# Patient Record
Sex: Female | Born: 1955
Health system: Southern US, Community
[De-identification: ages and names within clinical notes are randomized; demographics above are authoritative.]

## PROBLEM LIST (undated history)

## (undated) DIAGNOSIS — M1990 Unspecified osteoarthritis, unspecified site: Secondary | ICD-10-CM

## (undated) DIAGNOSIS — K5900 Constipation, unspecified: Secondary | ICD-10-CM

## (undated) DIAGNOSIS — J189 Pneumonia, unspecified organism: Secondary | ICD-10-CM

## (undated) DIAGNOSIS — E079 Disorder of thyroid, unspecified: Secondary | ICD-10-CM

## (undated) DIAGNOSIS — I499 Cardiac arrhythmia, unspecified: Secondary | ICD-10-CM

## (undated) DIAGNOSIS — E119 Type 2 diabetes mellitus without complications: Secondary | ICD-10-CM

## (undated) DIAGNOSIS — E785 Hyperlipidemia, unspecified: Secondary | ICD-10-CM

## (undated) DIAGNOSIS — I10 Essential (primary) hypertension: Secondary | ICD-10-CM

## (undated) HISTORY — DX: Hyperlipidemia, unspecified: E78.5

## (undated) HISTORY — PX: APPENDECTOMY: SHX54

## (undated) HISTORY — DX: Disorder of thyroid, unspecified: E07.9

## (undated) HISTORY — DX: Constipation, unspecified: K59.00

---

## 2016-01-20 DIAGNOSIS — J329 Chronic sinusitis, unspecified: Secondary | ICD-10-CM | POA: Diagnosis not present

## 2016-04-18 DIAGNOSIS — Z1389 Encounter for screening for other disorder: Secondary | ICD-10-CM | POA: Diagnosis not present

## 2016-04-18 DIAGNOSIS — Z760 Encounter for issue of repeat prescription: Secondary | ICD-10-CM | POA: Diagnosis not present

## 2016-04-18 DIAGNOSIS — I10 Essential (primary) hypertension: Secondary | ICD-10-CM | POA: Diagnosis not present

## 2016-04-18 DIAGNOSIS — Z6828 Body mass index (BMI) 28.0-28.9, adult: Secondary | ICD-10-CM | POA: Diagnosis not present

## 2016-05-17 DIAGNOSIS — Z79899 Other long term (current) drug therapy: Secondary | ICD-10-CM | POA: Diagnosis not present

## 2016-05-17 DIAGNOSIS — E7800 Pure hypercholesterolemia, unspecified: Secondary | ICD-10-CM | POA: Diagnosis not present

## 2016-05-17 DIAGNOSIS — E049 Nontoxic goiter, unspecified: Secondary | ICD-10-CM | POA: Diagnosis not present

## 2016-07-03 DIAGNOSIS — H60539 Acute contact otitis externa, unspecified ear: Secondary | ICD-10-CM | POA: Diagnosis not present

## 2017-04-09 DIAGNOSIS — Z6827 Body mass index (BMI) 27.0-27.9, adult: Secondary | ICD-10-CM | POA: Diagnosis not present

## 2017-04-09 DIAGNOSIS — J0100 Acute maxillary sinusitis, unspecified: Secondary | ICD-10-CM | POA: Diagnosis not present

## 2017-05-20 DIAGNOSIS — I10 Essential (primary) hypertension: Secondary | ICD-10-CM | POA: Diagnosis not present

## 2017-05-20 DIAGNOSIS — E041 Nontoxic single thyroid nodule: Secondary | ICD-10-CM | POA: Diagnosis not present

## 2017-05-20 DIAGNOSIS — E7800 Pure hypercholesterolemia, unspecified: Secondary | ICD-10-CM | POA: Diagnosis not present

## 2017-05-20 DIAGNOSIS — Z1331 Encounter for screening for depression: Secondary | ICD-10-CM | POA: Diagnosis not present

## 2017-06-05 DIAGNOSIS — E041 Nontoxic single thyroid nodule: Secondary | ICD-10-CM | POA: Diagnosis not present

## 2017-06-05 DIAGNOSIS — E785 Hyperlipidemia, unspecified: Secondary | ICD-10-CM | POA: Diagnosis not present

## 2017-06-05 DIAGNOSIS — Z79899 Other long term (current) drug therapy: Secondary | ICD-10-CM | POA: Diagnosis not present

## 2017-06-05 DIAGNOSIS — E7800 Pure hypercholesterolemia, unspecified: Secondary | ICD-10-CM | POA: Diagnosis not present

## 2017-07-13 DIAGNOSIS — S80861A Insect bite (nonvenomous), right lower leg, initial encounter: Secondary | ICD-10-CM | POA: Diagnosis not present

## 2017-10-31 DIAGNOSIS — M79671 Pain in right foot: Secondary | ICD-10-CM | POA: Diagnosis not present

## 2017-10-31 DIAGNOSIS — Z6828 Body mass index (BMI) 28.0-28.9, adult: Secondary | ICD-10-CM | POA: Diagnosis not present

## 2017-10-31 DIAGNOSIS — Z23 Encounter for immunization: Secondary | ICD-10-CM | POA: Diagnosis not present

## 2017-10-31 DIAGNOSIS — Z1339 Encounter for screening examination for other mental health and behavioral disorders: Secondary | ICD-10-CM | POA: Diagnosis not present

## 2017-12-26 DIAGNOSIS — M5412 Radiculopathy, cervical region: Secondary | ICD-10-CM | POA: Diagnosis not present

## 2017-12-26 DIAGNOSIS — M79671 Pain in right foot: Secondary | ICD-10-CM | POA: Diagnosis not present

## 2017-12-26 DIAGNOSIS — I10 Essential (primary) hypertension: Secondary | ICD-10-CM | POA: Diagnosis not present

## 2017-12-26 DIAGNOSIS — M5000 Cervical disc disorder with myelopathy, unspecified cervical region: Secondary | ICD-10-CM | POA: Diagnosis not present

## 2018-01-27 DIAGNOSIS — M7751 Other enthesopathy of right foot: Secondary | ICD-10-CM | POA: Diagnosis not present

## 2018-01-27 DIAGNOSIS — M722 Plantar fascial fibromatosis: Secondary | ICD-10-CM | POA: Diagnosis not present

## 2018-02-06 DIAGNOSIS — J111 Influenza due to unidentified influenza virus with other respiratory manifestations: Secondary | ICD-10-CM | POA: Diagnosis not present

## 2018-08-31 DIAGNOSIS — Z23 Encounter for immunization: Secondary | ICD-10-CM | POA: Diagnosis not present

## 2018-08-31 DIAGNOSIS — Z203 Contact with and (suspected) exposure to rabies: Secondary | ICD-10-CM | POA: Diagnosis not present

## 2018-08-31 DIAGNOSIS — Z2914 Encounter for prophylactic rabies immune globin: Secondary | ICD-10-CM | POA: Diagnosis not present

## 2018-09-03 DIAGNOSIS — Z23 Encounter for immunization: Secondary | ICD-10-CM | POA: Diagnosis not present

## 2018-09-03 DIAGNOSIS — Z203 Contact with and (suspected) exposure to rabies: Secondary | ICD-10-CM | POA: Diagnosis not present

## 2018-09-03 DIAGNOSIS — Z2914 Encounter for prophylactic rabies immune globin: Secondary | ICD-10-CM | POA: Diagnosis not present

## 2018-09-07 DIAGNOSIS — Z23 Encounter for immunization: Secondary | ICD-10-CM | POA: Diagnosis not present

## 2018-09-07 DIAGNOSIS — Z2914 Encounter for prophylactic rabies immune globin: Secondary | ICD-10-CM | POA: Diagnosis not present

## 2018-09-07 DIAGNOSIS — Z203 Contact with and (suspected) exposure to rabies: Secondary | ICD-10-CM | POA: Diagnosis not present

## 2018-09-15 DIAGNOSIS — Z203 Contact with and (suspected) exposure to rabies: Secondary | ICD-10-CM | POA: Diagnosis not present

## 2018-09-15 DIAGNOSIS — Z2914 Encounter for prophylactic rabies immune globin: Secondary | ICD-10-CM | POA: Diagnosis not present

## 2018-09-15 DIAGNOSIS — Z23 Encounter for immunization: Secondary | ICD-10-CM | POA: Diagnosis not present

## 2018-11-05 DIAGNOSIS — Z6828 Body mass index (BMI) 28.0-28.9, adult: Secondary | ICD-10-CM | POA: Diagnosis not present

## 2018-11-05 DIAGNOSIS — I10 Essential (primary) hypertension: Secondary | ICD-10-CM | POA: Diagnosis not present

## 2018-11-05 DIAGNOSIS — Z23 Encounter for immunization: Secondary | ICD-10-CM | POA: Diagnosis not present

## 2018-11-05 DIAGNOSIS — Z1331 Encounter for screening for depression: Secondary | ICD-10-CM | POA: Diagnosis not present

## 2018-11-13 DIAGNOSIS — Z20828 Contact with and (suspected) exposure to other viral communicable diseases: Secondary | ICD-10-CM | POA: Diagnosis not present

## 2018-12-04 DIAGNOSIS — E041 Nontoxic single thyroid nodule: Secondary | ICD-10-CM | POA: Diagnosis not present

## 2018-12-04 DIAGNOSIS — E7800 Pure hypercholesterolemia, unspecified: Secondary | ICD-10-CM | POA: Diagnosis not present

## 2018-12-04 DIAGNOSIS — Z79899 Other long term (current) drug therapy: Secondary | ICD-10-CM | POA: Diagnosis not present

## 2018-12-05 DIAGNOSIS — Z20828 Contact with and (suspected) exposure to other viral communicable diseases: Secondary | ICD-10-CM | POA: Diagnosis not present

## 2019-05-20 DIAGNOSIS — Z79899 Other long term (current) drug therapy: Secondary | ICD-10-CM | POA: Diagnosis not present

## 2019-05-20 DIAGNOSIS — E041 Nontoxic single thyroid nodule: Secondary | ICD-10-CM | POA: Diagnosis not present

## 2019-05-20 DIAGNOSIS — Z23 Encounter for immunization: Secondary | ICD-10-CM | POA: Diagnosis not present

## 2019-07-30 DIAGNOSIS — M542 Cervicalgia: Secondary | ICD-10-CM | POA: Diagnosis not present

## 2019-07-30 DIAGNOSIS — Z6824 Body mass index (BMI) 24.0-24.9, adult: Secondary | ICD-10-CM | POA: Diagnosis not present

## 2019-07-30 DIAGNOSIS — M79601 Pain in right arm: Secondary | ICD-10-CM | POA: Diagnosis not present

## 2019-09-03 DIAGNOSIS — M4712 Other spondylosis with myelopathy, cervical region: Secondary | ICD-10-CM | POA: Diagnosis not present

## 2019-09-03 DIAGNOSIS — R634 Abnormal weight loss: Secondary | ICD-10-CM | POA: Diagnosis not present

## 2019-09-03 DIAGNOSIS — M4722 Other spondylosis with radiculopathy, cervical region: Secondary | ICD-10-CM | POA: Diagnosis not present

## 2019-09-03 DIAGNOSIS — E1165 Type 2 diabetes mellitus with hyperglycemia: Secondary | ICD-10-CM | POA: Diagnosis not present

## 2019-09-25 DIAGNOSIS — Z124 Encounter for screening for malignant neoplasm of cervix: Secondary | ICD-10-CM | POA: Diagnosis not present

## 2019-09-25 DIAGNOSIS — Z01419 Encounter for gynecological examination (general) (routine) without abnormal findings: Secondary | ICD-10-CM | POA: Diagnosis not present

## 2019-09-25 DIAGNOSIS — Z1151 Encounter for screening for human papillomavirus (HPV): Secondary | ICD-10-CM | POA: Diagnosis not present

## 2019-09-29 DIAGNOSIS — M542 Cervicalgia: Secondary | ICD-10-CM | POA: Diagnosis not present

## 2019-10-05 DIAGNOSIS — M542 Cervicalgia: Secondary | ICD-10-CM | POA: Diagnosis not present

## 2019-10-08 DIAGNOSIS — M542 Cervicalgia: Secondary | ICD-10-CM | POA: Diagnosis not present

## 2019-10-26 DIAGNOSIS — M542 Cervicalgia: Secondary | ICD-10-CM | POA: Diagnosis not present

## 2019-10-27 DIAGNOSIS — Z1231 Encounter for screening mammogram for malignant neoplasm of breast: Secondary | ICD-10-CM | POA: Diagnosis not present

## 2019-10-30 DIAGNOSIS — M542 Cervicalgia: Secondary | ICD-10-CM | POA: Diagnosis not present

## 2019-11-03 DIAGNOSIS — M542 Cervicalgia: Secondary | ICD-10-CM | POA: Diagnosis not present

## 2019-11-09 DIAGNOSIS — M542 Cervicalgia: Secondary | ICD-10-CM | POA: Diagnosis not present

## 2019-11-10 DIAGNOSIS — M5412 Radiculopathy, cervical region: Secondary | ICD-10-CM | POA: Diagnosis not present

## 2019-11-10 DIAGNOSIS — M542 Cervicalgia: Secondary | ICD-10-CM | POA: Diagnosis not present

## 2019-12-15 DIAGNOSIS — H9193 Unspecified hearing loss, bilateral: Secondary | ICD-10-CM | POA: Diagnosis not present

## 2019-12-15 DIAGNOSIS — Z13220 Encounter for screening for lipoid disorders: Secondary | ICD-10-CM | POA: Diagnosis not present

## 2019-12-15 DIAGNOSIS — Z6824 Body mass index (BMI) 24.0-24.9, adult: Secondary | ICD-10-CM | POA: Diagnosis not present

## 2019-12-15 DIAGNOSIS — Z131 Encounter for screening for diabetes mellitus: Secondary | ICD-10-CM | POA: Diagnosis not present

## 2019-12-15 DIAGNOSIS — Z0000 Encounter for general adult medical examination without abnormal findings: Secondary | ICD-10-CM | POA: Diagnosis not present

## 2019-12-15 DIAGNOSIS — E0500 Thyrotoxicosis with diffuse goiter without thyrotoxic crisis or storm: Secondary | ICD-10-CM | POA: Diagnosis not present

## 2019-12-18 DIAGNOSIS — E0500 Thyrotoxicosis with diffuse goiter without thyrotoxic crisis or storm: Secondary | ICD-10-CM | POA: Diagnosis not present

## 2019-12-18 DIAGNOSIS — E041 Nontoxic single thyroid nodule: Secondary | ICD-10-CM | POA: Diagnosis not present

## 2019-12-18 DIAGNOSIS — E034 Atrophy of thyroid (acquired): Secondary | ICD-10-CM | POA: Diagnosis not present

## 2019-12-28 DIAGNOSIS — E041 Nontoxic single thyroid nodule: Secondary | ICD-10-CM | POA: Diagnosis not present

## 2019-12-28 DIAGNOSIS — E0789 Other specified disorders of thyroid: Secondary | ICD-10-CM | POA: Diagnosis not present

## 2020-01-14 DIAGNOSIS — H903 Sensorineural hearing loss, bilateral: Secondary | ICD-10-CM | POA: Diagnosis not present

## 2020-01-14 DIAGNOSIS — H6121 Impacted cerumen, right ear: Secondary | ICD-10-CM | POA: Diagnosis not present

## 2020-01-14 DIAGNOSIS — H61302 Acquired stenosis of left external ear canal, unspecified: Secondary | ICD-10-CM | POA: Diagnosis not present

## 2020-01-14 DIAGNOSIS — H9319 Tinnitus, unspecified ear: Secondary | ICD-10-CM | POA: Diagnosis not present

## 2020-02-03 ENCOUNTER — Ambulatory Visit: Payer: Self-pay | Admitting: Surgery

## 2020-02-03 DIAGNOSIS — Z923 Personal history of irradiation: Secondary | ICD-10-CM | POA: Diagnosis not present

## 2020-02-03 DIAGNOSIS — E041 Nontoxic single thyroid nodule: Secondary | ICD-10-CM | POA: Diagnosis not present

## 2020-02-03 DIAGNOSIS — Z8639 Personal history of other endocrine, nutritional and metabolic disease: Secondary | ICD-10-CM | POA: Diagnosis not present

## 2020-02-03 DIAGNOSIS — D440 Neoplasm of uncertain behavior of thyroid gland: Secondary | ICD-10-CM | POA: Diagnosis not present

## 2020-02-08 ENCOUNTER — Other Ambulatory Visit (HOSPITAL_COMMUNITY)
Admission: RE | Admit: 2020-02-08 | Discharge: 2020-02-08 | Disposition: A | Discharge status: Home / Self Care | Payer: BC Managed Care – PPO | Source: Ambulatory Visit | Attending: Surgery | Admitting: Surgery

## 2020-02-08 DIAGNOSIS — Z01812 Encounter for preprocedural laboratory examination: Secondary | ICD-10-CM | POA: Insufficient documentation

## 2020-02-08 DIAGNOSIS — Z20822 Contact with and (suspected) exposure to covid-19: Secondary | ICD-10-CM | POA: Diagnosis not present

## 2020-02-08 LAB — SARS CORONAVIRUS 2 (TAT 6-24 HRS): SARS Coronavirus 2: NEGATIVE

## 2020-02-08 NOTE — Progress Notes (Signed)
DUE TO COVID-19 ONLY ONE VISITOR IS ALLOWED TO COME WITH YOU AND STAY IN THE WAITING ROOM ONLY DURING PRE OP AND PROCEDURE DAY OF SURGERY. THE 1 VISITOR  MAY VISIT WITH YOU AFTER SURGERY IN YOUR PRIVATE ROOM DURING VISITING HOURS ONLY!  YOU NEED TO HAVE A COVID 19 TEST ON_______ @_______ , THIS TEST MUST BE DONE BEFORE SURGERY,  COVID TESTING SITE [address]  [id number], IT IS ON THE RIGHT GOING OUT [address] APPROXIMATELY  2 MINUTES PAST [employer] ON THE RIGHT. ONCE YOUR COVID TEST IS COMPLETED,  PLEASE BEGIN THE QUARANTINE INSTRUCTIONS AS OUTLINED IN YOUR HANDOUT.                Taylor Griffith  02/08/2020   Your procedure is scheduled on:  [id number]    Report to [facility] Main  Entrance   Report to admitting at    0600 AM     Call this number if you have problems the morning of surgery [phone]    REMEMBER: NO  SOLID FOOD CANDY OR GUM AFTER MIDNIGHT. CLEAR LIQUIDS UNTIL    0500am      . NOTHING BY MOUTH EXCEPT CLEAR LIQUIDS UNTIL    . PLEASE FINISH ENSURE DRINK PER SURGEON ORDER  WHICH NEEDS TO BE COMPLETED AT     0500am  .      CLEAR LIQUID DIET   Foods Allowed                                                                    Coffee and tea, regular and decaf                            Fruit ices (not with fruit pulp)                                      Iced Popsicles                                    Carbonated beverages, regular and diet                                    Cranberry, grape and apple juices Sports drinks like Gatorade Lightly seasoned clear broth or consume(fat free) Sugar, honey syrup ___________________________________________________________________      BRUSH YOUR TEETH MORNING OF SURGERY AND RINSE YOUR MOUTH OUT, NO CHEWING GUM CANDY OR MINTS.     Take these medicines the morning of surgery with A SIP OF WATER: none   DO NOT TAKE ANY DIABETIC MEDICATIONS DAY OF YOUR SURGERY                                You may not have any metal on your body including hair pins and              piercings  Do not  wear jewelry, make-up, lotions, powders or perfumes, deodorant             Do not wear nail polish on your fingernails.  Do not shave  48 hours prior to surgery.              Men may shave face and neck.   Do not bring valuables to the hospital. [place].  Contacts, dentures or bridgework may not be worn into surgery.  Leave suitcase in the car. After surgery it may be brought to your room.     Patients discharged the day of surgery will not be allowed to drive home. IF YOU ARE HAVING SURGERY AND GOING HOME THE SAME DAY, YOU MUST HAVE AN ADULT TO DRIVE YOU HOME AND BE WITH YOU FOR 24 HOURS. YOU MAY GO HOME BY TAXI OR UBER OR ORTHERWISE, BUT AN ADULT MUST ACCOMPANY YOU HOME AND STAY WITH YOU FOR 24 HOURS.  Name and phone number of your driver:  Special Instructions: N/A              Please read over the following fact sheets you were given: _____________________________________________________________________  [facility] - Preparing for Surgery Before surgery, you can play an important role.  Because skin is not sterile, your skin needs to be as free of germs as possible.  You can reduce the number of germs on your skin by washing with CHG (chlorahexidine gluconate) soap before surgery.  CHG is an antiseptic cleaner which kills germs and bonds with the skin to continue killing germs even after washing. Please DO NOT use if you have an allergy to CHG or antibacterial soaps.  If your skin becomes reddened/irritated stop using the CHG and inform your nurse when you arrive at [facility]. Do not shave (including legs and underarms) for at least 48 hours prior to the first CHG shower.  You may shave your face/neck. Please follow these instructions carefully:  1.  Shower with CHG Soap the night before surgery and the  morning of Surgery.  2.  If you  choose to wash your hair, wash your hair first as usual with your  normal  shampoo.  3.  After you shampoo, rinse your hair and body thoroughly to remove the  shampoo.                           4.  Use CHG as you would any other liquid soap.  You can apply chg directly  to the skin and wash                       Gently with a scrungie or clean washcloth.  5.  Apply the CHG Soap to your body ONLY FROM THE NECK DOWN.   Do not use on face/ open                           Wound or open sores. Avoid contact with eyes, ears mouth and genitals (private parts).                       Wash face,  Genitals (private parts) with your normal soap.             6.  Wash thoroughly, paying  special attention to the area where your surgery  will be performed.  7.  Thoroughly rinse your body with warm water from the neck down.  8.  DO NOT shower/wash with your normal soap after using and rinsing off  the CHG Soap.                9.  Pat yourself dry with a clean towel.            10.  Wear clean pajamas.            11.  Place clean sheets on your bed the night of your first shower and do not  sleep with pets. Day of Surgery : Do not apply any lotions/deodorants the morning of surgery.  Please wear clean clothes to the hospital/surgery center.  FAILURE TO FOLLOW THESE INSTRUCTIONS MAY RESULT IN THE CANCELLATION OF YOUR SURGERY PATIENT SIGNATURE_________________________________  NURSE SIGNATURE__________________________________  ________________________________________________________________________

## 2020-02-09 ENCOUNTER — Encounter (INDEPENDENT_AMBULATORY_CARE_PROVIDER_SITE_OTHER): Payer: Self-pay

## 2020-02-09 ENCOUNTER — Encounter (HOSPITAL_COMMUNITY)
Admission: RE | Admit: 2020-02-09 | Discharge: 2020-02-09 | Disposition: A | Discharge status: Home / Self Care | Payer: BC Managed Care – PPO | Source: Ambulatory Visit | Attending: Surgery | Admitting: Surgery

## 2020-02-09 ENCOUNTER — Encounter (HOSPITAL_COMMUNITY): Payer: Self-pay

## 2020-02-09 ENCOUNTER — Ambulatory Visit (HOSPITAL_COMMUNITY)
Admission: RE | Admit: 2020-02-09 | Discharge: 2020-02-09 | Disposition: A | Discharge status: Home / Self Care | Payer: BC Managed Care – PPO | Source: Ambulatory Visit | Attending: Anesthesiology | Admitting: Anesthesiology

## 2020-02-09 ENCOUNTER — Encounter (HOSPITAL_COMMUNITY): Payer: Self-pay | Admitting: Surgery

## 2020-02-09 ENCOUNTER — Other Ambulatory Visit: Payer: Self-pay

## 2020-02-09 DIAGNOSIS — I10 Essential (primary) hypertension: Secondary | ICD-10-CM | POA: Diagnosis not present

## 2020-02-09 DIAGNOSIS — Z01818 Encounter for other preprocedural examination: Secondary | ICD-10-CM | POA: Insufficient documentation

## 2020-02-09 DIAGNOSIS — D440 Neoplasm of uncertain behavior of thyroid gland: Secondary | ICD-10-CM | POA: Diagnosis present

## 2020-02-09 DIAGNOSIS — E041 Nontoxic single thyroid nodule: Secondary | ICD-10-CM | POA: Diagnosis present

## 2020-02-09 HISTORY — DX: Essential (primary) hypertension: I10

## 2020-02-09 HISTORY — DX: Unspecified osteoarthritis, unspecified site: M19.90

## 2020-02-09 HISTORY — DX: Cardiac arrhythmia, unspecified: I49.9

## 2020-02-09 HISTORY — DX: Pneumonia, unspecified organism: J18.9

## 2020-02-09 HISTORY — DX: Type 2 diabetes mellitus without complications: E11.9

## 2020-02-09 LAB — BASIC METABOLIC PANEL
Anion gap: 7 (ref 5–15)
BUN: 15 mg/dL (ref 8–23)
CO2: 30 mmol/L (ref 22–32)
Calcium: 9.7 mg/dL (ref 8.9–10.3)
Chloride: 104 mmol/L (ref 98–111)
Creatinine, Ser: 0.7 mg/dL (ref 0.44–1.00)
GFR, Estimated: >60 mL/min (ref >60)
Glucose, Bld: 115 mg/dL — ABNORMAL HIGH (ref 70–99)
Potassium: 4.1 mmol/L (ref 3.5–5.1)
Sodium: 141 mmol/L (ref 135–145)

## 2020-02-09 LAB — CBC
HCT: 43.7 % (ref 36.0–46.0)
Hemoglobin: 14.1 g/dL (ref 12.0–15.0)
MCH: 29.3 pg (ref 26.0–34.0)
MCHC: 32.3 g/dL (ref 30.0–36.0)
MCV: 90.9 fL (ref 80.0–100.0)
Platelets: 268 10*3/uL (ref 150–400)
RBC: 4.81 MIL/uL (ref 3.87–5.11)
RDW: 13.1 % (ref 11.5–15.5)
WBC: 5.1 10*3/uL (ref 4.0–10.5)
nRBC: 0 % (ref 0.0–0.2)

## 2020-02-09 LAB — HEMOGLOBIN A1C
Hgb A1c MFr Bld: 6.1 % — ABNORMAL HIGH (ref 4.8–5.6)
Mean Plasma Glucose: 128.37 mg/dL

## 2020-02-09 LAB — GLUCOSE, CAPILLARY: Glucose-Capillary: 120 mg/dL — ABNORMAL HIGH (ref 70–99)

## 2020-02-09 NOTE — H&P (Signed)
General Surgery [facility]  Taylor [facility] DOB: 11/19/1955 Married / Language: English / Race: White Female   History of Present Illness   The patient is a 65 year old female who presents with a thyroid nodule.  CHIEF COMPLAINT: thyroid neoplasm of uncertain behavior  Patient is referred by Dr. Serita Grammes for surgical evaluation and management of a newly diagnosed left thyroid neoplasm of uncertain behavior. Patient has a history of thyroid disease dating back many years. She had originally been diagnosed with hyperthyroidism. She underwent treatment with radioactive iodine. Following treatment her thyroid hormone levels normalized and she did not require medication. Patient had a known left-sided nodule which is been present for many years. The patient is unsure as to whether or not this has increased in size. She has no compressive symptoms. She denies any dyspnea or changes in voice. Her most recent TSH level from August 2021 is normal at 3.482. Patient was referred for ultrasound examination which was performed on December 18, 2019. This shows atrophy of the right thyroid lobe measuring only 1.6 x 0.6 x 0.4 cm. Left thyroid lobe is mildly enlarged at 5.7 x 2.7 x 2.8 cm. In the mid left lobe is a dominant nodule measuring 4.6 cm in greatest dimension. This was felt to be moderately suspicious and fine-needle aspiration biopsy was recommended. Patient underwent fine-needle aspiration biopsy on December 28, 2019. 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 This was sent for molecular genetic testing and returned with a result of positive, yielding a risk of malignancy of between 50 and 60%. Patient was therefore referred to surgery for resection for definitive diagnosis and management. Patient has had no prior head or neck surgery. There is a history of medical thyroid disease in multiple family members. There is  no family history of thyroid cancer.   Past Surgical History  Appendectomy   Diagnostic Studies History  Colonoscopy  never Mammogram  within last year Pap Smear  1-5 years ago  Allergies  Sulfa 10 *OPHTHALMIC AGENTS*  Zithromax *MACROLIDES*  Rocephin *CEPHALOSPORINS*  Allergies Reconciled   Medication History  Lisinopril-hydroCHLOROthiazide (20-12.5MG  Tablet, Oral) Active. Rosuvastatin Calcium (5MG  Tablet, Oral) Active. SM Non-Asprin Sinus (30-500MG  Tablet, Oral) Active. Multi Vitamin (Oral) Active. Vitamin D (50 MCG(2000 UT) Capsule, Oral) Active. Medications Reconciled  Social History  Alcohol use  Occasional alcohol use. Caffeine use  Carbonated beverages. No drug use  Tobacco use  Never smoker.  Family History  Alcohol Abuse  Family Members In General. Arthritis  Sister. Cancer  Family Members In General, Mother. Cerebrovascular Accident  Family Members In General. Depression  Mother. Diabetes Mellitus  Father. Heart Disease  Father. Respiratory Condition  Sister. Thyroid problems  Daughter.  Pregnancy / Birth History  Age at menarche  54 years. Age of menopause  51-55 Contraceptive History  Oral contraceptives. Gravida  2 Length (months) of breastfeeding  7-12 Maternal age  34-30 Para  2  Other Problems  Arthritis  Diabetes Mellitus  High blood pressure  Hypercholesterolemia  Other disease, cancer, significant illness  Thyroid Disease   Review of Systems  General Not Present- Appetite Loss, Chills, Fatigue, Fever, Night Sweats, Weight Gain and Weight Loss. Skin Not Present- Change in Wart/Mole, Dryness, Hives, Jaundice, New Lesions, Non-Healing Wounds, Rash and Ulcer. HEENT Present- Hearing Loss, Ringing in the Ears and Wears glasses/contact lenses. Not Present- Earache, Hoarseness, Nose Bleed, Oral Ulcers, Seasonal Allergies, Sinus Pain, Sore Throat, Visual Disturbances and Yellow Eyes.  Respiratory Not  Present- Bloody sputum, Chronic Cough, Difficulty Breathing, Snoring and Wheezing. Breast Not Present- Breast Mass, Breast Pain, Nipple Discharge and Skin Changes. Cardiovascular Not Present- Chest Pain, Difficulty Breathing Lying Down, Leg Cramps, Palpitations, Rapid Heart Rate, Shortness of Breath and Swelling of Extremities. Gastrointestinal Present- Constipation. Not Present- Abdominal Pain, Bloating, Bloody Stool, Change in Bowel Habits, Chronic diarrhea, Difficulty Swallowing, Excessive gas, Gets full quickly at meals, Hemorrhoids, Indigestion, Nausea, Rectal Pain and Vomiting. Female Genitourinary Not Present- Frequency, Nocturia, Painful Urination, Pelvic Pain and Urgency. Musculoskeletal Not Present- Back Pain, Joint Pain, Joint Stiffness, Muscle Pain, Muscle Weakness and Swelling of Extremities. Neurological Not Present- Decreased Memory, Fainting, Headaches, Numbness, Seizures, Tingling, Tremor, Trouble walking and Weakness. Psychiatric Not Present- Anxiety, Bipolar, Change in Sleep Pattern, Depression, Fearful and Frequent crying. Endocrine Present- Hair Changes and Hot flashes. Not Present- Cold Intolerance, Excessive Hunger, Heat Intolerance and New Diabetes. Hematology Not Present- Blood Thinners, Easy Bruising, Excessive bleeding, Gland problems, HIV and Persistent Infections.  Vitals  Weight: 134.5 lb Height: 62in Body Surface Area: 1.61 m Body Mass Index: 24.6 kg/m  Temp.: 98.76F  Pulse: 135 (Regular)  P.OX: 96% (Room air) BP: 142/90(Sitting, Left Arm, Standard)  Physical Exam  GENERAL APPEARANCE Development: normal Nutritional status: normal Gross deformities: none  SKIN Rash, lesions, ulcers: none Induration, erythema: none Nodules: none palpable  EYES Conjunctiva and lids: normal Pupils: equal and reactive Iris: normal bilaterally  EARS, NOSE, MOUTH, THROAT External ears: no lesion or deformity External nose: no lesion or deformity Hearing:  grossly normal Due to Covid-19 pandemic, patient is wearing a mask.  NECK Symmetric: no Trachea: midline Thyroid: There is a dominant smooth mobile nontender mass involving a large portion of the left thyroid lobe measuring approximately 5 cm in greatest dimension. It is mobile with swallowing. Palpation in the right thyroid bed shows no significant nodularity. There is no associated lymphadenopathy.  CHEST Respiratory effort: normal Retraction or accessory muscle use: no Breath sounds: normal bilaterally Rales, rhonchi, wheeze: none  CARDIOVASCULAR Auscultation: regular rhythm, normal rate Murmurs: none Pulses: radial pulse 2+ palpable Lower extremity edema: none  MUSCULOSKELETAL Station and gait: normal Digits and nails: no clubbing or cyanosis Muscle strength: grossly normal all extremities Range of motion: grossly normal all extremities Deformity: none  LYMPHATIC Cervical: none palpable Supraclavicular: none palpable  PSYCHIATRIC Oriented to person, place, and time: yes Mood and affect: normal for situation Judgment and insight: appropriate for situation    Assessment & Plan  NEOPLASM OF UNCERTAIN BEHAVIOR OF THYROID GLAND (D44.0) LEFT THYROID NODULE (E04.1) HISTORY OF HYPERTHYROIDISM (Z86.39) HISTORY OF RADIOACTIVE IODINE THYROID ABLATION (Z92.3)  Patient is referred by her primary care physician for surgical evaluation and management of a newly diagnosed thyroid neoplasm of uncertain behavior.  Patient provided with a copy of "The Thyroid Book: Medical and Surgical Treatment of Thyroid Problems", published by [employer], 16 pages. Book reviewed and explained to patient during visit today.  The patient and I reviewed her ultrasound report, her cytopathology report, and her molecular genetic report. There is a dominant mass in the left thyroid lobe measuring 4.6 cm. This has cytologic atypia. Molecular genetic study gives it a risk of malignancy of 50-60%. We  discussed proceeding with left thyroid lobectomy. We discussed the possibility of removing the atrophic right thyroid lobe if this could be done without increased risk. We discussed the risk of the procedure including the risk of injury to recurrent laryngeal nerves and parathyroid glands. We discussed the size location of  the surgical incision. We discussed the hospital stay to be anticipated. We discussed her postoperative recovery and return to work and activities. We discussed the need for lifelong thyroid hormone replacement. We discussed the potential need for radioactive iodine treatment. The patient understands and wishes to proceed with surgery in the near future.  The risks and benefits of the procedure have been discussed at length with the patient. The patient understands the proposed procedure, potential alternative treatments, and the course of recovery to be expected. All of the patient's questions have been answered at this time. The patient wishes to proceed with surgery.  Armandina Gemma, MD [facility] Office: [phone]

## 2020-02-09 NOTE — Progress Notes (Signed)
Pt received the G2lower  Sugar preop drink at preop appt on 02/09/20.

## 2020-02-10 NOTE — Anesthesia Preprocedure Evaluation (Addendum)
Anesthesia Evaluation  Patient identified by MRN, date of birth, ID band Patient awake    Reviewed: Allergy & Precautions, NPO status , Patient's Chart, lab work & pertinent test results  Airway Mallampati: I  TM Distance: >3 FB Neck ROM: Full    Dental no notable dental hx. (+) Teeth Intact, Dental Advisory Given   Pulmonary neg pulmonary ROS,    Pulmonary exam normal breath sounds clear to auscultation       Cardiovascular Exercise Tolerance: Good hypertension, Pt. on medications Normal cardiovascular exam Rhythm:Regular Rate:Normal     Neuro/Psych negative neurological ROS  negative psych ROS   GI/Hepatic negative GI ROS, Neg liver ROS,   Endo/Other  diabetes, Well Controlled, Type 2Thyroid mass  Renal/GU K 4.1 Cr 0.70     Musculoskeletal  (+) Arthritis ,   Abdominal   Peds  Hematology Hgb 14.1   Anesthesia Other Findings All: Azithromycin, rocephin, sulfa  Reproductive/Obstetrics                           Anesthesia Physical Anesthesia Plan  ASA: III  Anesthesia Plan: General   Post-op Pain Management:    Induction: Intravenous  PONV Risk Score and Plan: 4 or greater and Treatment may vary due to age or medical condition, Midazolam, Ondansetron, Dexamethasone, Scopolamine patch - Pre-op and Amisulpride  Airway Management Planned: Oral ETT  Additional Equipment: None  Intra-op Plan:   Post-operative Plan: Extubation in OR  Informed Consent: I have reviewed the patients History and Physical, chart, labs and discussed the procedure including the risks, benefits and alternatives for the proposed anesthesia with the patient or authorized representative who has indicated his/her understanding and acceptance.     Dental advisory given  Plan Discussed with: CRNA  Anesthesia Plan Comments:       Anesthesia Quick Evaluation

## 2020-02-11 ENCOUNTER — Encounter (HOSPITAL_COMMUNITY): Payer: Self-pay | Admitting: Surgery

## 2020-02-11 ENCOUNTER — Ambulatory Visit (HOSPITAL_COMMUNITY): Payer: BC Managed Care – PPO | Admitting: Anesthesiology

## 2020-02-11 ENCOUNTER — Other Ambulatory Visit: Payer: Self-pay

## 2020-02-11 ENCOUNTER — Ambulatory Visit (HOSPITAL_COMMUNITY)
Admission: RE | Admit: 2020-02-11 | Discharge: 2020-02-12 | Disposition: A | Discharge status: Home / Self Care | Payer: BC Managed Care – PPO | Attending: Surgery | Admitting: Surgery

## 2020-02-11 ENCOUNTER — Ambulatory Visit (HOSPITAL_COMMUNITY): Payer: BC Managed Care – PPO | Admitting: Physician Assistant

## 2020-02-11 ENCOUNTER — Encounter (HOSPITAL_COMMUNITY)
Admission: RE | Disposition: A | Discharge status: Home / Self Care | Payer: Self-pay | Source: Home / Self Care | Attending: Surgery

## 2020-02-11 DIAGNOSIS — I10 Essential (primary) hypertension: Secondary | ICD-10-CM | POA: Diagnosis not present

## 2020-02-11 DIAGNOSIS — D440 Neoplasm of uncertain behavior of thyroid gland: Secondary | ICD-10-CM | POA: Diagnosis not present

## 2020-02-11 DIAGNOSIS — Z79899 Other long term (current) drug therapy: Secondary | ICD-10-CM | POA: Diagnosis not present

## 2020-02-11 DIAGNOSIS — E041 Nontoxic single thyroid nodule: Secondary | ICD-10-CM | POA: Diagnosis present

## 2020-02-11 DIAGNOSIS — C73 Malignant neoplasm of thyroid gland: Secondary | ICD-10-CM | POA: Insufficient documentation

## 2020-02-11 DIAGNOSIS — J189 Pneumonia, unspecified organism: Secondary | ICD-10-CM | POA: Diagnosis not present

## 2020-02-11 DIAGNOSIS — E119 Type 2 diabetes mellitus without complications: Secondary | ICD-10-CM | POA: Diagnosis not present

## 2020-02-11 HISTORY — PX: THYROID LOBECTOMY: SHX420

## 2020-02-11 LAB — GLUCOSE, CAPILLARY: Glucose-Capillary: 105 mg/dL — ABNORMAL HIGH (ref 70–99)

## 2020-02-11 SURGERY — LOBECTOMY, THYROID
Anesthesia: General | Site: Neck | Laterality: Bilateral

## 2020-02-11 MED ORDER — ACETAMINOPHEN 10 MG/ML IV SOLN: 1000.0000 mg | Freq: Once | INTRAVENOUS | Status: DC | PRN

## 2020-02-11 MED ORDER — FENTANYL CITRATE (PF) 100 MCG/2ML IJ SOLN
INTRAMUSCULAR | Status: DC | PRN
  Administered 2020-02-11 (×2): 50 ug via INTRAVENOUS
  Administered 2020-02-11: 100 ug via INTRAVENOUS

## 2020-02-11 MED ORDER — SCOPOLAMINE 1 MG/3DAYS TD PT72
1.0000 | MEDICATED_PATCH | TRANSDERMAL | Status: DC
  Administered 2020-02-11: 1.5 mg via TRANSDERMAL
  Filled 2020-02-11: qty 1

## 2020-02-11 MED ORDER — ONDANSETRON 4 MG PO TBDP: 4.0000 mg | ORAL_TABLET | Freq: Four times a day (QID) | ORAL | Status: DC | PRN

## 2020-02-11 MED ORDER — SUGAMMADEX SODIUM 200 MG/2ML IV SOLN
INTRAVENOUS | Status: DC | PRN
  Administered 2020-02-11: 200 mg via INTRAVENOUS

## 2020-02-11 MED ORDER — LIDOCAINE HCL (CARDIAC) PF 100 MG/5ML IV SOSY
PREFILLED_SYRINGE | INTRAVENOUS | Status: DC | PRN
  Administered 2020-02-11: 100 mg via INTRAVENOUS

## 2020-02-11 MED ORDER — FENTANYL CITRATE (PF) 100 MCG/2ML IJ SOLN
INTRAMUSCULAR | Status: AC
  Filled 2020-02-11: qty 2

## 2020-02-11 MED ORDER — PROPOFOL 10 MG/ML IV BOLUS
INTRAVENOUS | Status: AC
  Filled 2020-02-11: qty 20

## 2020-02-11 MED ORDER — CALCIUM CARBONATE 1250 (500 CA) MG PO TABS
2.0000 | ORAL_TABLET | Freq: Three times a day (TID) | ORAL | Status: DC
  Administered 2020-02-11 – 2020-02-12 (×2): 1000 mg via ORAL
  Filled 2020-02-11 (×2): qty 1

## 2020-02-11 MED ORDER — LISINOPRIL 20 MG PO TABS
20.0000 mg | ORAL_TABLET | Freq: Every day | ORAL | Status: DC
  Administered 2020-02-11 – 2020-02-12 (×2): 20 mg via ORAL
  Filled 2020-02-11 (×2): qty 1

## 2020-02-11 MED ORDER — HYDROMORPHONE HCL 1 MG/ML IJ SOLN
INTRAMUSCULAR | Status: AC
  Filled 2020-02-11: qty 2

## 2020-02-11 MED ORDER — CHLORHEXIDINE GLUCONATE 0.12 % MT SOLN
15.0000 mL | Freq: Once | OROMUCOSAL | Status: AC
  Administered 2020-02-11: 15 mL via OROMUCOSAL

## 2020-02-11 MED ORDER — CHLORHEXIDINE GLUCONATE CLOTH 2 % EX PADS: 6.0000 | MEDICATED_PAD | Freq: Once | CUTANEOUS | Status: DC

## 2020-02-11 MED ORDER — LACTATED RINGERS IV SOLN
INTRAVENOUS | Status: DC
  Administered 2020-02-11: 1000 mL via INTRAVENOUS

## 2020-02-11 MED ORDER — LISINOPRIL-HYDROCHLOROTHIAZIDE 20-12.5 MG PO TABS: 1.0000 | ORAL_TABLET | Freq: Every day | ORAL | Status: DC

## 2020-02-11 MED ORDER — PROPOFOL 10 MG/ML IV BOLUS
INTRAVENOUS | Status: DC | PRN
  Administered 2020-02-11: 120 mg via INTRAVENOUS

## 2020-02-11 MED ORDER — ONDANSETRON HCL 4 MG/2ML IJ SOLN: 4.0000 mg | Freq: Four times a day (QID) | INTRAMUSCULAR | Status: DC | PRN

## 2020-02-11 MED ORDER — MIDAZOLAM HCL 5 MG/5ML IJ SOLN
INTRAMUSCULAR | Status: DC | PRN
  Administered 2020-02-11: 2 mg via INTRAVENOUS

## 2020-02-11 MED ORDER — HYDROCHLOROTHIAZIDE 12.5 MG PO CAPS
12.5000 mg | ORAL_CAPSULE | Freq: Every day | ORAL | Status: DC
  Administered 2020-02-11 – 2020-02-12 (×2): 12.5 mg via ORAL
  Filled 2020-02-11 (×2): qty 1

## 2020-02-11 MED ORDER — ONDANSETRON HCL 4 MG/2ML IJ SOLN
INTRAMUSCULAR | Status: DC | PRN
  Administered 2020-02-11: 4 mg via INTRAVENOUS

## 2020-02-11 MED ORDER — 0.9 % SODIUM CHLORIDE (POUR BTL) OPTIME
TOPICAL | Status: DC | PRN
  Administered 2020-02-11: 1000 mL

## 2020-02-11 MED ORDER — AMISULPRIDE (ANTIEMETIC) 5 MG/2ML IV SOLN: 10.0000 mg | Freq: Once | INTRAVENOUS | Status: DC | PRN

## 2020-02-11 MED ORDER — OXYCODONE HCL 5 MG PO TABS: 5.0000 mg | ORAL_TABLET | ORAL | Status: DC | PRN

## 2020-02-11 MED ORDER — SODIUM CHLORIDE 0.45 % IV SOLN: INTRAVENOUS | Status: DC

## 2020-02-11 MED ORDER — ONDANSETRON HCL 4 MG/2ML IJ SOLN
INTRAMUSCULAR | Status: AC
  Filled 2020-02-11: qty 2

## 2020-02-11 MED ORDER — CIPROFLOXACIN IN D5W 400 MG/200ML IV SOLN
400.0000 mg | INTRAVENOUS | Status: AC
  Administered 2020-02-11 (×2): 400 mg via INTRAVENOUS
  Filled 2020-02-11: qty 200

## 2020-02-11 MED ORDER — ROCURONIUM BROMIDE 10 MG/ML (PF) SYRINGE
PREFILLED_SYRINGE | INTRAVENOUS | Status: AC
  Filled 2020-02-11: qty 10

## 2020-02-11 MED ORDER — ACETAMINOPHEN 325 MG PO TABS: 650.0000 mg | ORAL_TABLET | Freq: Four times a day (QID) | ORAL | Status: DC | PRN

## 2020-02-11 MED ORDER — HYDROCODONE-ACETAMINOPHEN 7.5-325 MG PO TABS: 1.0000 | ORAL_TABLET | Freq: Once | ORAL | Status: DC | PRN

## 2020-02-11 MED ORDER — TRAMADOL HCL 50 MG PO TABS
50.0000 mg | ORAL_TABLET | Freq: Four times a day (QID) | ORAL | Status: DC | PRN
  Administered 2020-02-11 (×2): 50 mg via ORAL
  Filled 2020-02-11 (×2): qty 1

## 2020-02-11 MED ORDER — ORAL CARE MOUTH RINSE: 15.0000 mL | Freq: Once | OROMUCOSAL | Status: AC

## 2020-02-11 MED ORDER — ONDANSETRON HCL 4 MG/2ML IJ SOLN: 4.0000 mg | Freq: Once | INTRAMUSCULAR | Status: DC | PRN

## 2020-02-11 MED ORDER — ACETAMINOPHEN 650 MG RE SUPP: 650.0000 mg | Freq: Four times a day (QID) | RECTAL | Status: DC | PRN

## 2020-02-11 MED ORDER — MIDAZOLAM HCL 2 MG/2ML IJ SOLN
INTRAMUSCULAR | Status: AC
  Filled 2020-02-11: qty 2

## 2020-02-11 MED ORDER — MEPERIDINE HCL 50 MG/ML IJ SOLN: 6.2500 mg | INTRAMUSCULAR | Status: DC | PRN

## 2020-02-11 MED ORDER — DEXAMETHASONE SODIUM PHOSPHATE 10 MG/ML IJ SOLN
INTRAMUSCULAR | Status: AC
  Filled 2020-02-11: qty 1

## 2020-02-11 MED ORDER — DEXAMETHASONE SODIUM PHOSPHATE 10 MG/ML IJ SOLN
INTRAMUSCULAR | Status: DC | PRN
  Administered 2020-02-11: 10 mg via INTRAVENOUS

## 2020-02-11 MED ORDER — ROCURONIUM BROMIDE 100 MG/10ML IV SOLN
INTRAVENOUS | Status: DC | PRN
  Administered 2020-02-11: 60 mg via INTRAVENOUS
  Administered 2020-02-11: 20 mg via INTRAVENOUS

## 2020-02-11 MED ORDER — HYDROMORPHONE HCL 1 MG/ML IJ SOLN: 1.0000 mg | INTRAMUSCULAR | Status: DC | PRN

## 2020-02-11 MED ORDER — HYDROMORPHONE HCL 1 MG/ML IJ SOLN
0.2500 mg | INTRAMUSCULAR | Status: DC | PRN
  Administered 2020-02-11 (×3): 0.5 mg via INTRAVENOUS

## 2020-02-11 SURGICAL SUPPLY — 29 items
ATTRACTOMAT 16X20 MAGNETIC DRP (DRAPES) ×2 IMPLANT
BLADE SURG 15 STRL LF DISP TIS (BLADE) ×1 IMPLANT
BLADE SURG 15 STRL SS (BLADE) ×1
CHLORAPREP W/TINT 26 (MISCELLANEOUS) ×2 IMPLANT
CLIP VESOCCLUDE MED 6/CT (CLIP) ×6 IMPLANT
CLIP VESOCCLUDE SM WIDE 6/CT (CLIP) ×8 IMPLANT
COVER SURGICAL LIGHT HANDLE (MISCELLANEOUS) ×2 IMPLANT
COVER WAND RF STERILE (DRAPES) ×2 IMPLANT
DERMABOND ADVANCED (GAUZE/BANDAGES/DRESSINGS) ×1
DERMABOND ADVANCED .7 DNX12 (GAUZE/BANDAGES/DRESSINGS) ×1 IMPLANT
DRAPE LAPAROTOMY T 98X78 PEDS (DRAPES) ×2 IMPLANT
DRAPE UTILITY XL STRL (DRAPES) ×2 IMPLANT
ELECT PENCIL ROCKER SW 15FT (MISCELLANEOUS) ×2 IMPLANT
ELECT REM PT RETURN 15FT ADLT (MISCELLANEOUS) ×2 IMPLANT
GAUZE 4X4 16PLY RFD (DISPOSABLE) ×2 IMPLANT
GLOVE SURG ORTHO LTX SZ8 (GLOVE) ×2 IMPLANT
GOWN STRL REUS W/TWL XL LVL3 (GOWN DISPOSABLE) ×4 IMPLANT
HEMOSTAT SURGICEL 2X4 FIBR (HEMOSTASIS) ×2 IMPLANT
ILLUMINATOR WAVEGUIDE N/F (MISCELLANEOUS) ×2 IMPLANT
KIT BASIN OR (CUSTOM PROCEDURE TRAY) ×2 IMPLANT
KIT TURNOVER KIT A (KITS) ×2 IMPLANT
PACK BASIC VI WITH GOWN DISP (CUSTOM PROCEDURE TRAY) ×6 IMPLANT
SHEARS HARMONIC 9CM CVD (BLADE) ×2 IMPLANT
SUT MNCRL AB 4-0 PS2 18 (SUTURE) ×2 IMPLANT
SUT VIC AB 3-0 SH 18 (SUTURE) ×4 IMPLANT
SYR BULB IRRIG 60ML STRL (SYRINGE) ×2 IMPLANT
TOWEL OR 17X26 10 PK STRL BLUE (TOWEL DISPOSABLE) ×2 IMPLANT
TOWEL OR NON WOVEN STRL DISP B (DISPOSABLE) ×2 IMPLANT
TUBING CONNECTING 10 (TUBING) ×2 IMPLANT

## 2020-02-11 NOTE — Anesthesia Procedure Notes (Signed)
Performed by: Josie Mesa C, CRNA       

## 2020-02-11 NOTE — Transfer of Care (Signed)
Immediate Anesthesia Transfer of Care Note  Patient: KHAMYA TOPP  Procedure(s) Performed: TOTAL THYROIDECTOMY (Bilateral Neck)  Patient Location: PACU  Anesthesia Type:General  Level of Consciousness: awake, alert  and oriented  Airway & Oxygen Therapy: Patient Spontanous Breathing and Patient connected to face mask oxygen  Post-op Assessment: Report given to RN and Post -op Vital signs reviewed and stable  Post vital signs: Reviewed and stable  Last Vitals:  Vitals Value Taken Time  BP 146/83 02/11/20 0951  Temp    Pulse 72 02/11/20 0956  Resp 17 02/11/20 0956  SpO2 100 % 02/11/20 0956  Vitals shown include unvalidated device data.  Last Pain:  Vitals:   02/11/20 0625  TempSrc: Oral         Complications: No complications documented.

## 2020-02-11 NOTE — Anesthesia Procedure Notes (Signed)
Procedure Name: Intubation Date/Time: 02/11/2020 8:22 AM Performed by: [place] Mariette Cowley C, CRNA Pre-anesthesia Checklist: Patient identified, Emergency Drugs available, Suction available and Patient being monitored Patient Re-evaluated:Patient Re-evaluated prior to induction Oxygen Delivery Method: Circle system utilized Preoxygenation: Pre-oxygenation with 100% oxygen Induction Type: IV induction Ventilation: Mask ventilation without difficulty Laryngoscope Size: Mac and 3 Grade View: Grade I Tube type: Oral Tube size: 7.0 mm Number of attempts: 1 Airway Equipment and Method: Stylet and Oral airway Placement Confirmation: ETT inserted through vocal cords under direct vision,  positive ETCO2 and breath sounds checked- equal and bilateral Secured at: 20 cm Tube secured with: Tape Dental Injury: Teeth and Oropharynx as per pre-operative assessment

## 2020-02-11 NOTE — Plan of Care (Signed)
  Problem: Education: Goal: Knowledge of General Education information will improve Description: Including pain rating scale, medication(s)/side effects and non-pharmacologic comfort measures Outcome: Progressing   Problem: Health Behavior/Discharge Planning: Goal: Ability to manage health-related needs will improve Outcome: Progressing   Problem: Clinical Measurements: Goal: Ability to maintain clinical measurements within normal limits will improve Outcome: Progressing Goal: Will remain free from infection Outcome: Progressing Goal: Diagnostic test results will improve Outcome: Progressing Goal: Respiratory complications will improve Outcome: Progressing Goal: Cardiovascular complication will be avoided Outcome: Progressing   Problem: Activity: Goal: Risk for activity intolerance will decrease Outcome: Progressing   Problem: Nutrition: Goal: Adequate nutrition will be maintained Outcome: Progressing   Problem: Coping: Goal: Level of anxiety will decrease Outcome: Progressing   Problem: Elimination: Goal: Will not experience complications related to bowel motility Outcome: Progressing Goal: Will not experience complications related to urinary retention Outcome: Progressing   Problem: Pain Managment: Goal: General experience of comfort will improve Outcome: Progressing   Problem: Safety: Goal: Ability to remain free from injury will improve Outcome: Progressing   Problem: Skin Integrity: Goal: Risk for impaired skin integrity will decrease Outcome: Progressing   Problem: Education: Goal: Knowledge of the prescribed therapeutic regimen will improve Outcome: Progressing   Problem: Clinical Measurements: Goal: Complications related to the disease process, condition or treatment will be avoided or minimized Outcome: Progressing   Problem: Respiratory: Goal: Ability to maintain a clear airway will improve Outcome: Progressing

## 2020-02-11 NOTE — Anesthesia Postprocedure Evaluation (Signed)
Anesthesia Post Note  Patient: Taylor [facility]  Procedure(s) Performed: TOTAL THYROIDECTOMY (Bilateral Neck)     Patient location during evaluation: PACU Anesthesia Type: General Level of consciousness: awake and alert Pain management: pain level controlled Vital Signs Assessment: post-procedure vital signs reviewed and stable Respiratory status: spontaneous breathing, nonlabored ventilation, respiratory function stable and patient connected to nasal cannula oxygen Cardiovascular status: blood pressure returned to baseline and stable Postop Assessment: no apparent nausea or vomiting Anesthetic complications: no   No complications documented.  Last Vitals:  Vitals:   02/11/20 1115 02/11/20 1201  BP: 129/71 (!) 149/76  Pulse: (!) 53 (!) 57  Resp: 12 18  Temp: 36.7 C 36.8 C  SpO2: 100% 100%    Last Pain:  Vitals:   02/11/20 1201  TempSrc: Oral  PainSc:                  [place]et [place]

## 2020-02-11 NOTE — Interval H&P Note (Signed)
History and Physical Interval Note:  02/11/2020 8:04 AM  Taylor [facility]  has presented today for surgery, with the diagnosis of THYROID NEOPLASM OF UNCERTAIN BEHAVIOR.  The various methods of treatment have been discussed with the patient and family. After consideration of risks, benefits and other options for treatment, the patient has consented to    Procedure(s): LEFT THYROID LOBECTOMY (Left) as a surgical intervention.    The patient's history has been reviewed, patient examined, no change in status, stable for surgery.  I have reviewed the patient's chart and labs.  Questions were answered to the patient's satisfaction.    Armandina Gemma, MD [facility] Office: [place]

## 2020-02-11 NOTE — Op Note (Signed)
Procedure Note  Pre-operative Diagnosis:  Thyroid neoplasm of uncertain behavior, left thyroid nodule  Post-operative Diagnosis:  same  Surgeon:  Armandina Gemma, MD  Assistant:  none   Procedure:  Total thyroidectomy  Anesthesia:  General  Estimated Blood Loss:  minimal  Drains: none         Specimen: thyroid to pathology  Indications:  Patient is referred by Dr. Serita Grammes for surgical evaluation and management of a newly diagnosed left thyroid neoplasm of uncertain behavior. Patient has a history of thyroid disease dating back many years. She had originally been diagnosed with hyperthyroidism. She underwent treatment with radioactive iodine. Following treatment her thyroid hormone levels normalized and she did not require medication. Patient had a known left-sided nodule which is been present for many years. The patient is unsure as to whether or not this has increased in size. She has no compressive symptoms. She denies any dyspnea or changes in voice. Her most recent TSH level from August 2021 is normal at 3.482. Patient was referred for ultrasound examination which was performed on December 18, 2019. This shows atrophy of the right thyroid lobe measuring only 1.6 x 0.6 x 0.4 cm. Left thyroid lobe is mildly enlarged at 5.7 x 2.7 x 2.8 cm. In the mid left lobe is a dominant nodule measuring 4.6 cm in greatest dimension. This was felt to be moderately suspicious and fine-needle aspiration biopsy was recommended. Patient underwent fine-needle aspiration biopsy on December 28, 2019. 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 This was sent for molecular genetic testing and returned with a result of positive, yielding a risk of malignancy of between 50 and 60%. Patient was therefore referred to surgery for resection for definitive diagnosis and management.   Procedure Details: Procedure was done in OR #4 at the [facility].  The patient was brought to the operating room and placed in a supine position on the operating room table. Following administration of general anesthesia, the patient was positioned and then prepped and draped in the usual aseptic fashion. After ascertaining that an adequate level of anesthesia had been achieved, a small Kocher incision was made with #15 blade. Dissection was carried through subcutaneous tissues and platysma.Hemostasis was achieved with the electrocautery. Skin flaps were elevated cephalad and caudad from the thyroid notch to the sternal notch. A Mahorner self-retaining retractor was placed for exposure. Strap muscles were incised in the midline and dissection was begun on the left side.  Strap muscles were reflected laterally.  Left thyroid lobe was moderately enlarged with a dominant nodule occupying most of the lobe.  The left lobe was gently mobilized with blunt dissection. Superior pole vessels were dissected out and divided individually between small and medium ligaclips with the harmonic scalpel. The thyroid lobe was rolled anteriorly. Branches of the inferior thyroid artery were divided between small ligaclips with the harmonic scalpel. Inferior venous tributaries were divided between ligaclips. The recurrent laryngeal nerve was identified and preserved along its course. The ligament of Gwenlyn Found was released with the electrocautery and the gland was mobilized onto the anterior trachea. Isthmus was mobilized across the midline. There was a tiny pyramidal lobe present which was resected with the isthmus. Dry pack was placed in the left neck.  The right thyroid lobe was gently mobilized with blunt dissection. Right thyroid lobe was diminuitive in size without nodules. Superior pole vessels were dissected out and divided between small ligaclips with the Harmonic scalpel. Inferior venous tributaries were divided  between small ligaclips with the harmonic scalpel. The right thyroid lobe was rolled  anteriorly and the branches of the inferior thyroid artery divided between small ligaclips. The ligament of Gwenlyn Found was released with the electrocautery. The right thyroid lobe was mobilized onto the anterior trachea and the remainder of the thyroid was dissected off the anterior trachea and the thyroid was completely excised. A single suture was used to mark the right lobe and a double suture was used to mark the left lobe. The entire thyroid gland was submitted to pathology for review.  The neck was irrigated with warm saline. Fibrillar was placed throughout the operative field. Strap muscles were approximated in the midline with interrupted 3-0 Vicryl sutures. Platysma was closed with interrupted 3-0 Vicryl sutures. Skin was closed with a running 4-0 Monocryl subcuticular suture. Wound was washed and Dermabond was applied. The patient was awakened from anesthesia and brought to the recovery room. The patient tolerated the procedure well.   Armandina Gemma, MD [facility] Office: [phone]

## 2020-02-12 ENCOUNTER — Encounter (HOSPITAL_COMMUNITY): Payer: Self-pay | Admitting: Surgery

## 2020-02-12 DIAGNOSIS — D440 Neoplasm of uncertain behavior of thyroid gland: Secondary | ICD-10-CM | POA: Diagnosis not present

## 2020-02-12 DIAGNOSIS — Z79899 Other long term (current) drug therapy: Secondary | ICD-10-CM | POA: Diagnosis not present

## 2020-02-12 DIAGNOSIS — C73 Malignant neoplasm of thyroid gland: Secondary | ICD-10-CM | POA: Diagnosis not present

## 2020-02-12 LAB — BASIC METABOLIC PANEL
Anion gap: 7 (ref 5–15)
BUN: 12 mg/dL (ref 8–23)
CO2: 27 mmol/L (ref 22–32)
Calcium: 9.2 mg/dL (ref 8.9–10.3)
Chloride: 105 mmol/L (ref 98–111)
Creatinine, Ser: 0.57 mg/dL (ref 0.44–1.00)
GFR, Estimated: >60 mL/min (ref >60)
Glucose, Bld: 118 mg/dL — ABNORMAL HIGH (ref 70–99)
Potassium: 3.6 mmol/L (ref 3.5–5.1)
Sodium: 139 mmol/L (ref 135–145)

## 2020-02-12 MED ORDER — LEVOTHYROXINE SODIUM 88 MCG PO TABS: 88.0000 ug | ORAL_TABLET | Freq: Every day | ORAL | 3 refills | Status: AC

## 2020-02-12 MED ORDER — CALCIUM CARBONATE ANTACID 500 MG PO CHEW: 2.0000 | CHEWABLE_TABLET | Freq: Two times a day (BID) | ORAL | 1 refills | Status: AC

## 2020-02-12 MED ORDER — TRAMADOL HCL 50 MG PO TABS: 50.0000 mg | ORAL_TABLET | Freq: Four times a day (QID) | ORAL | 0 refills | Status: AC | PRN

## 2020-02-12 NOTE — Plan of Care (Signed)

## 2020-02-12 NOTE — Progress Notes (Signed)
Pt was discharged home today. Instructions were reviewed with patient, and questions were answered. Pt was taken to main entrance via wheelchair by NT.  

## 2020-02-12 NOTE — Plan of Care (Signed)
  Problem: Education: Goal: Knowledge of General Education information will improve Description: Including pain rating scale, medication(s)/side effects and non-pharmacologic comfort measures Outcome: Progressing   Problem: Health Behavior/Discharge Planning: Goal: Ability to manage health-related needs will improve Outcome: Progressing   Problem: Clinical Measurements: Goal: Ability to maintain clinical measurements within normal limits will improve Outcome: Progressing Goal: Will remain free from infection Outcome: Progressing Goal: Diagnostic test results will improve Outcome: Progressing Goal: Respiratory complications will improve Outcome: Progressing Goal: Cardiovascular complication will be avoided Outcome: Progressing   Problem: Pain Managment: Goal: General experience of comfort will improve Outcome: Progressing   Problem: Elimination: Goal: Will not experience complications related to bowel motility Outcome: Progressing Goal: Will not experience complications related to urinary retention Outcome: Progressing   Problem: Coping: Goal: Level of anxiety will decrease Outcome: Progressing   Problem: Skin Integrity: Goal: Risk for impaired skin integrity will decrease Outcome: Progressing   Problem: Education: Goal: Knowledge of the prescribed therapeutic regimen will improve Outcome: Progressing   Problem: Respiratory: Goal: Ability to maintain a clear airway will improve Outcome: Progressing

## 2020-02-12 NOTE — Discharge Summary (Signed)
Physician Discharge Summary [facility]  Patient ID: Taylor Griffith MRN: [id number] DOB/AGE: 08/27/1955 65 y.o.  Admit date: 02/11/2020  Discharge date: 02/12/2020  Discharge Diagnoses:  Principal Problem:   Neoplasm of uncertain behavior of thyroid gland Active Problems:   Left thyroid nodule   Discharged Condition: good  Hospital Course: Patient was admitted for observation following thyroid surgery.  Post op course was uncomplicated.  Pain was well controlled.  Tolerated diet.  Post op calcium level on morning following surgery was 9.2 mg/dl.  Patient was prepared for discharge home on POD#1.  Consults: None  Treatments: surgery: total thyroidectomy  Discharge Exam: Blood pressure 110/67, pulse (!) 51, temperature 98.3 F (36.8 C), temperature source Oral, resp. rate 16, height 5\' 2"  (1.575 m), weight 60.3 kg, SpO2 96 %. HEENT - clear Neck - wound dry and intact; mild STS; voice normal Chest - clear bilaterally Cor - RRR  Disposition: Home  Discharge Instructions    Diet - low sodium heart healthy   Complete by: As directed    Discharge instructions   Complete by: As directed    [facility]  THYROID & PARATHYROID SURGERY:  POST-OP INSTRUCTIONS  Always review your discharge instruction sheet from the facility where your surgery was performed.  A prescription for pain medication may be given to you upon discharge.  Take your pain medication as prescribed.  If narcotic pain medicine is not needed, then you may take acetaminophen (Tylenol) or ibuprofen (Advil) as needed.  Take your usually prescribed medications unless otherwise directed.  If you need a refill on your pain medication, please contact our office during regular business hours.  Prescriptions cannot be processed by our office after 5 pm or on weekends.  Start with a light diet upon arrival home, such as soup and crackers or toast.  Be sure to drink plenty of  fluids daily.  Resume your normal diet the day after surgery.  Most patients will experience some swelling and bruising on the chest and neck area.  Ice packs will help.  Swelling and bruising can take several days to resolve.   It is common to experience some constipation after surgery.  Increasing fluid intake and taking a stool softener (Colace) will usually help or prevent this problem.  A mild laxative (Milk of Magnesia or Miralax) should be taken according to package directions if there has been no bowel movement after 48 hours.  You have steri-strips and a gauze dressing over your incision.  You may remove the gauze bandage on the second day after surgery, and you may shower at that time.  Leave your steri-strips (small skin tapes) in place directly over the incision.  These strips should remain on the skin for 5-7 days and then be removed.  You may get them wet in the shower and pat them dry.  You may resume regular (light) daily activities beginning the next day (such as daily self-care, walking, climbing stairs) gradually increasing activities as tolerated.  You may have sexual intercourse when it is comfortable.  Refrain from any heavy lifting or straining until approved by your doctor.  You may drive when you no longer are taking prescription pain medication, you can comfortably wear a seatbelt, and you can safely maneuver your car and apply brakes.  You should see your doctor in the office for a follow-up appointment approximately three weeks after your surgery.  Make sure that you call for this appointment within  a day or two after you arrive home to insure a convenient appointment time.  WHEN TO CALL YOUR DOCTOR: -- Fever greater than 101.5 -- Inability to urinate -- Nausea and/or vomiting - persistent -- Extreme swelling or bruising -- Continued bleeding from incision -- Increased pain, redness, or drainage from the incision -- Difficulty swallowing or breathing -- Muscle cramping  or spasms -- Numbness or tingling in hands or around lips  The clinic staff is available to answer your questions during regular business hours.  Please don't hesitate to call and ask to speak to one of the nurses if you have concerns.  Armandina Gemma, MD [facility] Office: [phone]   Ice pack   Complete by: As directed    Increase activity slowly   Complete by: As directed    No dressing needed   Complete by: As directed      Allergies as of 02/12/2020      Reactions   Azithromycin Hives   Rocephin [ceftriaxone] Hives   Sulfa Antibiotics Hives      Medication List    TAKE these medications   aspirin EC 81 MG tablet Take 81 mg by mouth daily. Swallow whole.   calcium carbonate 500 MG chewable tablet Commonly known as: Tums Chew 2 tablets (400 mg of elemental calcium total) by mouth 2 (two) times daily.   Calcium+D3 600-800 MG-UNIT Tabs Generic drug: Calcium Carb-Cholecalciferol Take 1 tablet by mouth 2 (two) times a week.   cholecalciferol 25 MCG (1000 UNIT) tablet Commonly known as: VITAMIN D Take 1,000 Units by mouth daily.   levothyroxine 88 MCG tablet Commonly known as: Synthroid Take 1 tablet (88 mcg total) by mouth daily before breakfast.   lisinopril-hydrochlorothiazide 20-12.5 MG tablet Commonly known as: ZESTORETIC Take 1 tablet by mouth daily.   multivitamin with minerals tablet Take 1 tablet by mouth daily.   rosuvastatin 5 MG tablet Commonly known as: CRESTOR Take 5 mg by mouth 2 (two) times a week.   traMADol 50 MG tablet Commonly known as: ULTRAM Take 1-2 tablets (50-100 mg total) by mouth every 6 (six) hours as needed.            Discharge Care Instructions  (From admission, onward)         Start     Ordered   02/12/20 0000  No dressing needed        02/12/20 3419          Follow-up Information    Armandina Gemma, MD. Schedule an appointment as soon as possible for a visit in 3 week(s).   Specialty: General  Surgery Contact information: [address] [place]  [address] [phone]               Armandina Gemma, [facility] Office: [phone]   Signed: Armandina Gemma 02/12/2020, 7:57 AM

## 2020-02-15 LAB — SURGICAL PATHOLOGY

## 2020-02-16 DIAGNOSIS — D440 Neoplasm of uncertain behavior of thyroid gland: Secondary | ICD-10-CM | POA: Diagnosis not present

## 2020-03-15 DIAGNOSIS — H6691 Otitis media, unspecified, right ear: Secondary | ICD-10-CM | POA: Diagnosis not present

## 2020-03-15 DIAGNOSIS — H66009 Acute suppurative otitis media without spontaneous rupture of ear drum, unspecified ear: Secondary | ICD-10-CM | POA: Diagnosis not present

## 2020-03-28 DIAGNOSIS — D440 Neoplasm of uncertain behavior of thyroid gland: Secondary | ICD-10-CM | POA: Diagnosis not present

## 2020-05-23 DIAGNOSIS — D497 Neoplasm of unspecified behavior of endocrine glands and other parts of nervous system: Secondary | ICD-10-CM | POA: Diagnosis not present

## 2020-05-23 DIAGNOSIS — I10 Essential (primary) hypertension: Secondary | ICD-10-CM | POA: Diagnosis not present

## 2020-05-23 DIAGNOSIS — E782 Mixed hyperlipidemia: Secondary | ICD-10-CM | POA: Diagnosis not present

## 2020-05-23 DIAGNOSIS — M7918 Myalgia, other site: Secondary | ICD-10-CM | POA: Diagnosis not present

## 2020-05-23 DIAGNOSIS — R7303 Prediabetes: Secondary | ICD-10-CM | POA: Diagnosis not present

## 2020-07-05 ENCOUNTER — Encounter: Payer: Self-pay | Admitting: Gastroenterology

## 2020-08-12 ENCOUNTER — Other Ambulatory Visit: Payer: Self-pay

## 2020-08-12 ENCOUNTER — Ambulatory Visit (AMBULATORY_SURGERY_CENTER): Payer: Self-pay | Admitting: *Deleted

## 2020-08-12 VITALS — Ht 61.0 in | Wt 130.0 lb

## 2020-08-12 DIAGNOSIS — Z1211 Encounter for screening for malignant neoplasm of colon: Secondary | ICD-10-CM

## 2020-08-12 MED ORDER — CLENPIQ 10-3.5-12 MG-GM -GM/160ML PO SOLN: 1.0000 | Freq: Once | ORAL | 0 refills | Status: AC

## 2020-08-12 NOTE — Progress Notes (Signed)
  No trouble with anesthesia, denies being told they were difficult to intubate, or hx/fam hx of malignant hyperthermia per pt   No egg or soy allergy  No home oxygen use   No medications for weight loss taken  emmi information given  Pt constipation issues- 2 day Clenpiq/Miralax prep given  Pt informed that we do not do prior authorizations for prep  Clenpiq coupon given

## 2020-08-15 ENCOUNTER — Telehealth: Payer: Self-pay | Admitting: Gastroenterology

## 2020-08-15 DIAGNOSIS — Z1211 Encounter for screening for malignant neoplasm of colon: Secondary | ICD-10-CM

## 2020-08-15 MED ORDER — SUTAB 1479-225-188 MG PO TABS: 24.0000 | ORAL_TABLET | ORAL | 0 refills | Status: DC

## 2020-08-15 MED ORDER — NA SULFATE-K SULFATE-MG SULF 17.5-3.13-1.6 GM/177ML PO SOLN: 1.0000 | Freq: Once | ORAL | 0 refills | Status: AC

## 2020-08-15 NOTE — Addendum Note (Signed)
Addended by: Steva Ready on: 08/15/2020 05:24 PM   Modules accepted: Orders

## 2020-08-15 NOTE — Telephone Encounter (Signed)
Pharmacy calling- states Clenpiq is 132$ with the coupon-  without coupon prep was $302  Suprep appears to be on formulary - I sent in prescription to [facility]- [facility] will call me back if it is not covered   I called pt and explained change - she and I went over Suprep instructions and she changed them on her instruction packet she has at home   [facility]

## 2020-08-15 NOTE — Telephone Encounter (Signed)
Inbound call from pharmacy stating only prep covered under patient's insurance is Sutab.  Please advise.

## 2020-08-15 NOTE — Telephone Encounter (Signed)
Per [place] is the only prep covered by pt's insurance - sent in [place] with coupon to [employer] pt- pt and I went over [place] instructions and she wrote the [place] instructions down as well - pt will call with questions and issues with [facility]

## 2020-08-23 ENCOUNTER — Telehealth: Payer: Self-pay | Admitting: Gastroenterology

## 2020-08-23 NOTE — Telephone Encounter (Signed)
All questions answered

## 2020-08-26 ENCOUNTER — Ambulatory Visit (AMBULATORY_SURGERY_CENTER): Payer: BC Managed Care – PPO | Admitting: Gastroenterology

## 2020-08-26 ENCOUNTER — Telehealth: Payer: Self-pay | Admitting: Gastroenterology

## 2020-08-26 ENCOUNTER — Encounter: Payer: Self-pay | Admitting: Gastroenterology

## 2020-08-26 VITALS — BP 130/65 | HR 56 | Temp 98.4°F | Resp 12 | Ht 61.0 in | Wt 130.0 lb

## 2020-08-26 DIAGNOSIS — Z1211 Encounter for screening for malignant neoplasm of colon: Secondary | ICD-10-CM | POA: Diagnosis not present

## 2020-08-26 MED ORDER — SODIUM CHLORIDE 0.9 % IV SOLN: 500.0000 mL | Freq: Once | INTRAVENOUS | Status: DC

## 2020-08-26 NOTE — Telephone Encounter (Signed)
Called patient back. She threw up about 35 minutes after finishing the Sutabs. She is having clear/pale yellow results with some sedement. Encouraged her to push her fluids until noon. Pt verbalized understanding.

## 2020-08-26 NOTE — Op Note (Signed)
[place] Patient Name: Taylor Griffith Procedure Date: 08/26/2020 2:51 PM MRN: [id number] Endoscopist: Jackquline Denmark , MD Age: 65 Referring MD:  Date of Birth: 1955/12/08 Gender: Female Account #: [id number] Procedure:                Colonoscopy Indications:              Screening for colorectal malignant neoplasm Medicines:                Monitored Anesthesia Care Procedure:                Pre-Anesthesia Assessment:                           - Prior to the procedure, a History and Physical                            was performed, and patient medications and                            allergies were reviewed. The patient's tolerance of                            previous anesthesia was also reviewed. The risks                            and benefits of the procedure and the sedation                            options and risks were discussed with the patient.                            All questions were answered, and informed consent                            was obtained. Prior Anticoagulants: The patient has                            taken no previous anticoagulant or antiplatelet                            agents. ASA Grade Assessment: II - A patient with                            mild systemic disease. After reviewing the risks                            and benefits, the patient was deemed in                            satisfactory condition to undergo the procedure.                           After obtaining informed consent, the colonoscope  was passed under direct vision. Throughout the                            procedure, the patient's blood pressure, pulse, and                            oxygen saturations were monitored continuously. The                            PCF-HQ190L Colonoscope was introduced through the                            anus and advanced to the the cecum, identified by                            appendiceal  orifice and ileocecal valve. The                            colonoscopy was performed without difficulty. The                            patient tolerated the procedure well. The quality                            of the bowel preparation was good. The ileocecal                            valve, appendiceal orifice, and rectum were                            photographed. Scope In: 2:57:04 PM Scope Out: 3:10:38 PM Scope Withdrawal Time: 0 hours 9 minutes 11 seconds  Total Procedure Duration: 0 hours 13 minutes 34 seconds  Findings:                 The colon (entire examined portion) appeared normal.                           Non-bleeding internal hemorrhoids were found during                            retroflexion. The hemorrhoids were small and Grade                            I (internal hemorrhoids that do not prolapse).                           The exam was otherwise without abnormality on                            direct and retroflexion views. Complications:            No immediate complications. Estimated Blood Loss:     Estimated blood loss: none. Impression:               - The entire  examined colon is normal.                           - Non-bleeding internal hemorrhoids.                           - The examination was otherwise normal on direct                            and retroflexion views.                           - No specimens collected. Recommendation:           - Patient has a contact number available for                            emergencies. The signs and symptoms of potential                            delayed complications were discussed with the                            patient. Return to normal activities tomorrow.                            Written discharge instructions were provided to the                            patient.                           - Resume previous diet.                           - Continue present medications.                            - Repeat colonoscopy in 10 years for screening                            purposes. Earlier, if with any new problems or                            change in family history.                           - The findings and recommendations were discussed                            with the patient's family. Jackquline Denmark, MD 08/26/2020 3:13:32 PM This report has been signed electronically.

## 2020-08-26 NOTE — Progress Notes (Signed)
No problems noted in the recovery room. maw 

## 2020-08-26 NOTE — Telephone Encounter (Signed)
Patient called seeking advise said she drank the second portion of prep and threw up.

## 2020-08-26 NOTE — Progress Notes (Signed)
C.W. vital signs. 

## 2020-08-26 NOTE — Progress Notes (Signed)
[facility] History and Physical   Primary Care Physician:  Serita Grammes, MD   Reason for Procedure:   Colorectal cancer screening  Plan:    Colon     HPI: Taylor Griffith is a 65 y.o. female    Past Medical History:  Diagnosis Date   Arthritis    Constipation    Diabetes mellitus without complication ([place])    type 2    Dysrhythmia    skipped beats    Hyperlipidemia    Hypertension    Pneumonia    hx of walking pneumonia    Thyroid disease     Past Surgical History:  Procedure Laterality Date   APPENDECTOMY     THYROID LOBECTOMY Bilateral 02/11/2020   Procedure: TOTAL THYROIDECTOMY;  Surgeon: Armandina Gemma, MD;  Location: [facility];  Service: General;  Laterality: Bilateral;    Prior to Admission medications   Medication Sig Start Date End Date Taking? Authorizing Provider  aspirin EC 81 MG tablet Take 81 mg by mouth daily. Swallow whole.   Yes [provider]  Calcium Carb-Cholecalciferol (CALCIUM+D3) 600-800 MG-UNIT TABS Take 1 tablet by mouth 2 (two) times a week.   Yes [provider]  Calcium Citrate-Vitamin D (CITRACAL + D PO) Take by mouth daily.   Yes [provider]  cholecalciferol (VITAMIN D) 25 MCG (1000 UNIT) tablet Take 1,000 Units by mouth daily.   Yes [provider]  levothyroxine (SYNTHROID) 88 MCG tablet Take 1 tablet (88 mcg total) by mouth daily before breakfast. Patient taking differently: Take 75 mcg by mouth daily before breakfast. 02/12/20  Yes Armandina Gemma, MD  lisinopril-hydrochlorothiazide (ZESTORETIC) 20-12.5 MG tablet Take 1 tablet by mouth daily. 01/19/20  Yes [provider]  METAMUCIL FIBER PO Take by mouth daily.   Yes [provider]  Multiple Vitamins-Minerals (MULTIVITAMIN WITH MINERALS) tablet Take 1 tablet by mouth daily.   Yes [provider]  rosuvastatin (CRESTOR) 5 MG tablet Take 5 mg by mouth 2 (two) times a week. 09/03/19  Yes [provider]   calcium carbonate (TUMS) 500 MG chewable tablet Chew 2 tablets (400 mg of elemental calcium total) by mouth 2 (two) times daily. Patient not taking: No sig reported 02/12/20   Armandina Gemma, MD  traMADol (ULTRAM) 50 MG tablet Take 1-2 tablets (50-100 mg total) by mouth every 6 (six) hours as needed. 02/12/20   Armandina Gemma, MD    Current Outpatient Medications  Medication Sig Dispense Refill   aspirin EC 81 MG tablet Take 81 mg by mouth daily. Swallow whole.     Calcium Carb-Cholecalciferol (CALCIUM+D3) 600-800 MG-UNIT TABS Take 1 tablet by mouth 2 (two) times a week.     Calcium Citrate-Vitamin D (CITRACAL + D PO) Take by mouth daily.     cholecalciferol (VITAMIN D) 25 MCG (1000 UNIT) tablet Take 1,000 Units by mouth daily.     levothyroxine (SYNTHROID) 88 MCG tablet Take 1 tablet (88 mcg total) by mouth daily before breakfast. (Patient taking differently: Take 75 mcg by mouth daily before breakfast.) 30 tablet 3   lisinopril-hydrochlorothiazide (ZESTORETIC) 20-12.5 MG tablet Take 1 tablet by mouth daily.     METAMUCIL FIBER PO Take by mouth daily.     Multiple Vitamins-Minerals (MULTIVITAMIN WITH MINERALS) tablet Take 1 tablet by mouth daily.     rosuvastatin (CRESTOR) 5 MG tablet Take 5 mg by mouth 2 (two) times a week.     calcium carbonate (TUMS) 500 MG chewable tablet Chew  2 tablets (400 mg of elemental calcium total) by mouth 2 (two) times daily. (Patient not taking: No sig reported) 90 tablet 1   traMADol (ULTRAM) 50 MG tablet Take 1-2 tablets (50-100 mg total) by mouth every 6 (six) hours as needed. 15 tablet 0   Current Facility-Administered Medications  Medication Dose Route Frequency Provider Last Rate Last Admin   0.9 %  sodium chloride infusion  500 mL Intravenous Once Jackquline Denmark, MD        Allergies as of 08/26/2020 - Review Complete 08/26/2020  Allergen Reaction Noted   Azithromycin Hives 02/05/2020   Rocephin [ceftriaxone] Hives 02/05/2020   Sulfa antibiotics Hives  02/05/2020    Family History  Problem Relation Age of Onset   Stomach cancer Paternal Grandfather    Colon cancer Neg Hx    Esophageal cancer Neg Hx    Rectal cancer Neg Hx     Social History   Socioeconomic History   Marital status: Married    Spouse name: Not on file   Number of children: Not on file   Years of education: Not on file   Highest education level: Not on file  Occupational History   Not on file  Tobacco Use   Smoking status: Never   Smokeless tobacco: Never  Vaping Use   Vaping Use: Never used  Substance and Sexual Activity   Alcohol use: Yes    Comment: occ    Drug use: Never   Sexual activity: Not on file  Other Topics Concern   Not on file  Social History Narrative   Not on file   Social Determinants of Health   Financial Resource Strain: Not on file  Food Insecurity: Not on file  Transportation Needs: Not on file  Physical Activity: Not on file  Stress: Not on file  Social Connections: Not on file  Intimate Partner Violence: Not on file    Review of Systems:  All other review of systems negative except as mentioned in the HPI.  Physical Exam: Vital signs in last 24 hours: '@VSRANGES'$ @   General:   Alert,  Well-developed, well-nourished, pleasant and cooperative in NAD Lungs:  Clear throughout to auscultation.   Heart:  Regular rate and rhythm; no murmurs, clicks, rubs,  or gallops. Abdomen:  Soft, nontender and nondistended. Normal bowel sounds.   Neuro/Psych:  Alert and cooperative. Normal mood and affect. A and O x 3    The patient  appropriate candidate for the planned procedure and anesthesia.   Carmell Austria, MD. [facility] 08/26/2020 2:22 PM@

## 2020-08-26 NOTE — Patient Instructions (Addendum)
Handout was given to your care partner on Hemorrhoids. You may resume your current medications today. Repeat colonoscopy in 10 years for screening purposes. Please call if any questions or concerns.      YOU HAD AN ENDOSCOPIC PROCEDURE TODAY AT [facility]:   Refer to the procedure report that was given to you for any specific questions about what was found during the examination.  If the procedure report does not answer your questions, please call your gastroenterologist to clarify.  If you requested that your care partner not be given the details of your procedure findings, then the procedure report has been included in a sealed envelope for you to review at your convenience later.  YOU SHOULD EXPECT: Some feelings of bloating in the abdomen. Passage of more gas than usual.  Walking can help get rid of the air that was put into your GI tract during the procedure and reduce the bloating. If you had a lower endoscopy (such as a colonoscopy or flexible sigmoidoscopy) you may notice spotting of blood in your stool or on the toilet paper. If you underwent a bowel prep for your procedure, you may not have a normal bowel movement for a few days.  Please Note:  You might notice some irritation and congestion in your nose or some drainage.  This is from the oxygen used during your procedure.  There is no need for concern and it should clear up in a day or so.  SYMPTOMS TO REPORT IMMEDIATELY:  Following lower endoscopy (colonoscopy or flexible sigmoidoscopy):  Excessive amounts of blood in the stool  Significant tenderness or worsening of abdominal pains  Swelling of the abdomen that is new, acute  Fever of 100F or higher   For urgent or emergent issues, a gastroenterologist can be reached at any hour by calling [phone]. Do not use MyChart messaging for urgent concerns.    DIET:  We do recommend a small meal at first, but then you may proceed to your regular diet.  Drink  plenty of fluids but you should avoid alcoholic beverages for 24 hours.  ACTIVITY:  You should plan to take it easy for the rest of today and you should NOT DRIVE or use heavy machinery until tomorrow (because of the sedation medicines used during the test).    FOLLOW UP: Our staff will call the number listed on your records 48-72 hours following your procedure to check on you and address any questions or concerns that you may have regarding the information given to you following your procedure. If we do not reach you, we will leave a message.  We will attempt to reach you two times.  During this call, we will ask if you have developed any symptoms of COVID 19. If you develop any symptoms (ie: fever, flu-like symptoms, shortness of breath, cough etc.) before then, please call [phone].  If you test positive for Covid 19 in the 2 weeks post procedure, please call and report this information to [place].    If any biopsies were taken you will be contacted by phone or by letter within the next 1-3 weeks.  Please call us at [phone] if you have not heard about the biopsies in 3 weeks.    SIGNATURES/CONFIDENTIALITY: You and/or your care partner have signed paperwork which will be entered into your electronic medical record.  These signatures attest to the fact that that the information above on your After Visit Summary has been reviewed and is  understood.  Full responsibility of the confidentiality of this discharge information lies with you and/or your care-partner.

## 2020-08-26 NOTE — Progress Notes (Signed)
Pt's states no medical or surgical changes since previsit or office visit. 

## 2020-08-26 NOTE — Progress Notes (Signed)
pt tolerated well. VSS. awake and to recovery. Report given to RN.  

## 2020-08-30 ENCOUNTER — Telehealth: Payer: Self-pay | Admitting: *Deleted

## 2020-08-30 NOTE — Telephone Encounter (Signed)
  Follow up Call-  Call back number 08/26/2020  Post procedure Call Back phone  # [phone]  Permission to leave phone message Yes  Some recent data might be hidden     Patient questions:  Do you have a fever, pain , or abdominal swelling? No. Pain Score  0 *  Have you tolerated food without any problems? Yes.    Have you been able to return to your normal activities? Yes.    Do you have any questions about your discharge instructions: Diet   No. Medications  No. Follow up visit  No.  Do you have questions or concerns about your Care? No.  Actions: * If pain score is 4 or above: No action needed, pain <4.  Have you developed a fever since your procedure? no  2.   Have you had an respiratory symptoms (SOB or cough) since your procedure? no  3.   Have you tested positive for COVID 19 since your procedure no  4.   Have you had any family members/close contacts diagnosed with the COVID 19 since your procedure?  no   If yes to any of these questions please route to Joylene John, RN and Joella Prince, RN

## 2020-09-06 DIAGNOSIS — D497 Neoplasm of unspecified behavior of endocrine glands and other parts of nervous system: Secondary | ICD-10-CM | POA: Diagnosis not present

## 2020-09-06 DIAGNOSIS — E1165 Type 2 diabetes mellitus with hyperglycemia: Secondary | ICD-10-CM | POA: Diagnosis not present

## 2020-09-06 DIAGNOSIS — Z6823 Body mass index (BMI) 23.0-23.9, adult: Secondary | ICD-10-CM | POA: Diagnosis not present

## 2020-09-06 DIAGNOSIS — I10 Essential (primary) hypertension: Secondary | ICD-10-CM | POA: Diagnosis not present

## 2020-09-09 ENCOUNTER — Encounter: Payer: BC Managed Care – PPO | Admitting: Gastroenterology

## 2020-11-02 DIAGNOSIS — Z1231 Encounter for screening mammogram for malignant neoplasm of breast: Secondary | ICD-10-CM | POA: Diagnosis not present

## 2020-11-08 DIAGNOSIS — J069 Acute upper respiratory infection, unspecified: Secondary | ICD-10-CM | POA: Diagnosis not present

## 2020-12-13 DIAGNOSIS — E119 Type 2 diabetes mellitus without complications: Secondary | ICD-10-CM | POA: Diagnosis not present

## 2020-12-13 DIAGNOSIS — I10 Essential (primary) hypertension: Secondary | ICD-10-CM | POA: Diagnosis not present

## 2020-12-13 DIAGNOSIS — D497 Neoplasm of unspecified behavior of endocrine glands and other parts of nervous system: Secondary | ICD-10-CM | POA: Diagnosis not present

## 2020-12-13 DIAGNOSIS — E782 Mixed hyperlipidemia: Secondary | ICD-10-CM | POA: Diagnosis not present

## 2020-12-16 DIAGNOSIS — I10 Essential (primary) hypertension: Secondary | ICD-10-CM | POA: Diagnosis not present

## 2020-12-16 DIAGNOSIS — Z01419 Encounter for gynecological examination (general) (routine) without abnormal findings: Secondary | ICD-10-CM | POA: Diagnosis not present

## 2020-12-16 DIAGNOSIS — E1165 Type 2 diabetes mellitus with hyperglycemia: Secondary | ICD-10-CM | POA: Diagnosis not present

## 2020-12-16 DIAGNOSIS — D497 Neoplasm of unspecified behavior of endocrine glands and other parts of nervous system: Secondary | ICD-10-CM | POA: Diagnosis not present

## 2020-12-23 DIAGNOSIS — Z6824 Body mass index (BMI) 24.0-24.9, adult: Secondary | ICD-10-CM | POA: Diagnosis not present

## 2020-12-23 DIAGNOSIS — M542 Cervicalgia: Secondary | ICD-10-CM | POA: Diagnosis not present

## 2021-03-08 DIAGNOSIS — E119 Type 2 diabetes mellitus without complications: Secondary | ICD-10-CM | POA: Diagnosis not present

## 2021-06-16 DIAGNOSIS — D497 Neoplasm of unspecified behavior of endocrine glands and other parts of nervous system: Secondary | ICD-10-CM | POA: Diagnosis not present

## 2021-06-16 DIAGNOSIS — Z1331 Encounter for screening for depression: Secondary | ICD-10-CM | POA: Diagnosis not present

## 2021-06-16 DIAGNOSIS — E782 Mixed hyperlipidemia: Secondary | ICD-10-CM | POA: Diagnosis not present

## 2021-06-16 DIAGNOSIS — I10 Essential (primary) hypertension: Secondary | ICD-10-CM | POA: Diagnosis not present

## 2021-06-27 DIAGNOSIS — D497 Neoplasm of unspecified behavior of endocrine glands and other parts of nervous system: Secondary | ICD-10-CM | POA: Diagnosis not present

## 2021-11-10 DIAGNOSIS — Z1231 Encounter for screening mammogram for malignant neoplasm of breast: Secondary | ICD-10-CM | POA: Diagnosis not present

## 2021-11-10 DIAGNOSIS — Z01419 Encounter for gynecological examination (general) (routine) without abnormal findings: Secondary | ICD-10-CM | POA: Diagnosis not present

## 2021-11-10 DIAGNOSIS — Z1239 Encounter for other screening for malignant neoplasm of breast: Secondary | ICD-10-CM | POA: Diagnosis not present

## 2021-11-13 DIAGNOSIS — H61303 Acquired stenosis of external ear canal, unspecified, bilateral: Secondary | ICD-10-CM | POA: Diagnosis not present

## 2021-11-13 DIAGNOSIS — H903 Sensorineural hearing loss, bilateral: Secondary | ICD-10-CM | POA: Diagnosis not present

## 2021-11-13 DIAGNOSIS — H9319 Tinnitus, unspecified ear: Secondary | ICD-10-CM | POA: Diagnosis not present

## 2021-11-13 DIAGNOSIS — H6123 Impacted cerumen, bilateral: Secondary | ICD-10-CM | POA: Diagnosis not present

## 2021-11-20 ENCOUNTER — Ambulatory Visit: Payer: Self-pay | Admitting: Licensed Clinical Social Worker

## 2021-11-20 NOTE — Patient Outreach (Signed)
  Care Coordination   11/20/2021 Name: Taylor Griffith MRN: [id number] DOB: Jun 29, 1955   Care Coordination Outreach Attempts:  An unsuccessful telephone outreach was attempted today to offer the patient information about available care coordination services as a benefit of their health plan.   Follow Up Plan:  Additional outreach attempts will be made to offer the patient care coordination information and services.   Encounter Outcome:  No Answer  Care Coordination Interventions Activated:  No   Care Coordination Interventions:  No, not indicated    Milus Height, BSW , MSW, [place] Social Worker [facility] Management  [phone]

## 2021-11-23 ENCOUNTER — Telehealth: Payer: Self-pay

## 2021-11-23 NOTE — Patient Outreach (Signed)
  Care Coordination   11/23/2021 Name: Taylor Griffith MRN: [id number] DOB: 1955/03/27   Care Coordination Outreach Attempts:  A second unsuccessful outreach was attempted today to offer the patient with information about available care coordination services as a benefit of their health plan.     Follow Up Plan:  Additional outreach attempts will be made to offer the patient care coordination information and services.   Encounter Outcome:  No Answer  Care Coordination Interventions Activated:  No   Care Coordination Interventions:  No, not indicated    Tomasa Rand, RN, BSN, CEN [facility] [employer] [phone]

## 2021-11-24 ENCOUNTER — Telehealth: Payer: Self-pay

## 2021-11-24 NOTE — Patient Outreach (Signed)
  Care Coordination   11/24/2021 Name: Taylor Griffith MRN: [id number] DOB: 06-15-1955   Care Coordination Outreach Attempts:  A third unsuccessful outreach was attempted today to offer the patient with information about available care coordination services as a benefit of their health plan.   Follow Up Plan:  No further outreach attempts will be made at this time. We have been unable to contact the patient to offer or enroll patient in care coordination services  Encounter Outcome:  No Answer  Care Coordination Interventions Activated:  No   Care Coordination Interventions:  No, not indicated    Tomasa Rand, RN, BSN, CEN [place] Coordinator [phone]

## 2021-11-24 NOTE — Patient Outreach (Signed)
  Care Coordination   Initial Visit Note   11/24/2021 Name: BETHANI BRUGGER MRN: [id number] DOB: 11/02/1955  KAIA DEPAOLIS is a 66 y.o. year old female who sees Serita Grammes, MD for primary care. I spoke with  Molli Barrows by phone today.  What matters to the patients health and wellness today?  Incoming call from patient. Reviewed reason for previous calls. Explained [facility] care coordination program. Patient reports that she is doing well and denies any needs at this time.    SDOH assessments and interventions completed:  No     Care Coordination Interventions Activated:  No  Care Coordination Interventions:  No, not indicated   Follow up plan: No further intervention required.   Encounter Outcome:  Pt. Refused   Tomasa Rand, RN, BSN, CEN [facility] [employer] [phone]

## 2022-04-04 IMAGING — DX DG CHEST 2V
2 series · 2 of 2 positions shown · non-contrast
Comparison: None.

CLINICAL DATA: Preoperative for left thyroid lobectomy. No current
chest complaints. Hypertension. Nonsmoker. Diabetes.

EXAM:
CHEST - 2 VIEW

[chest pa]
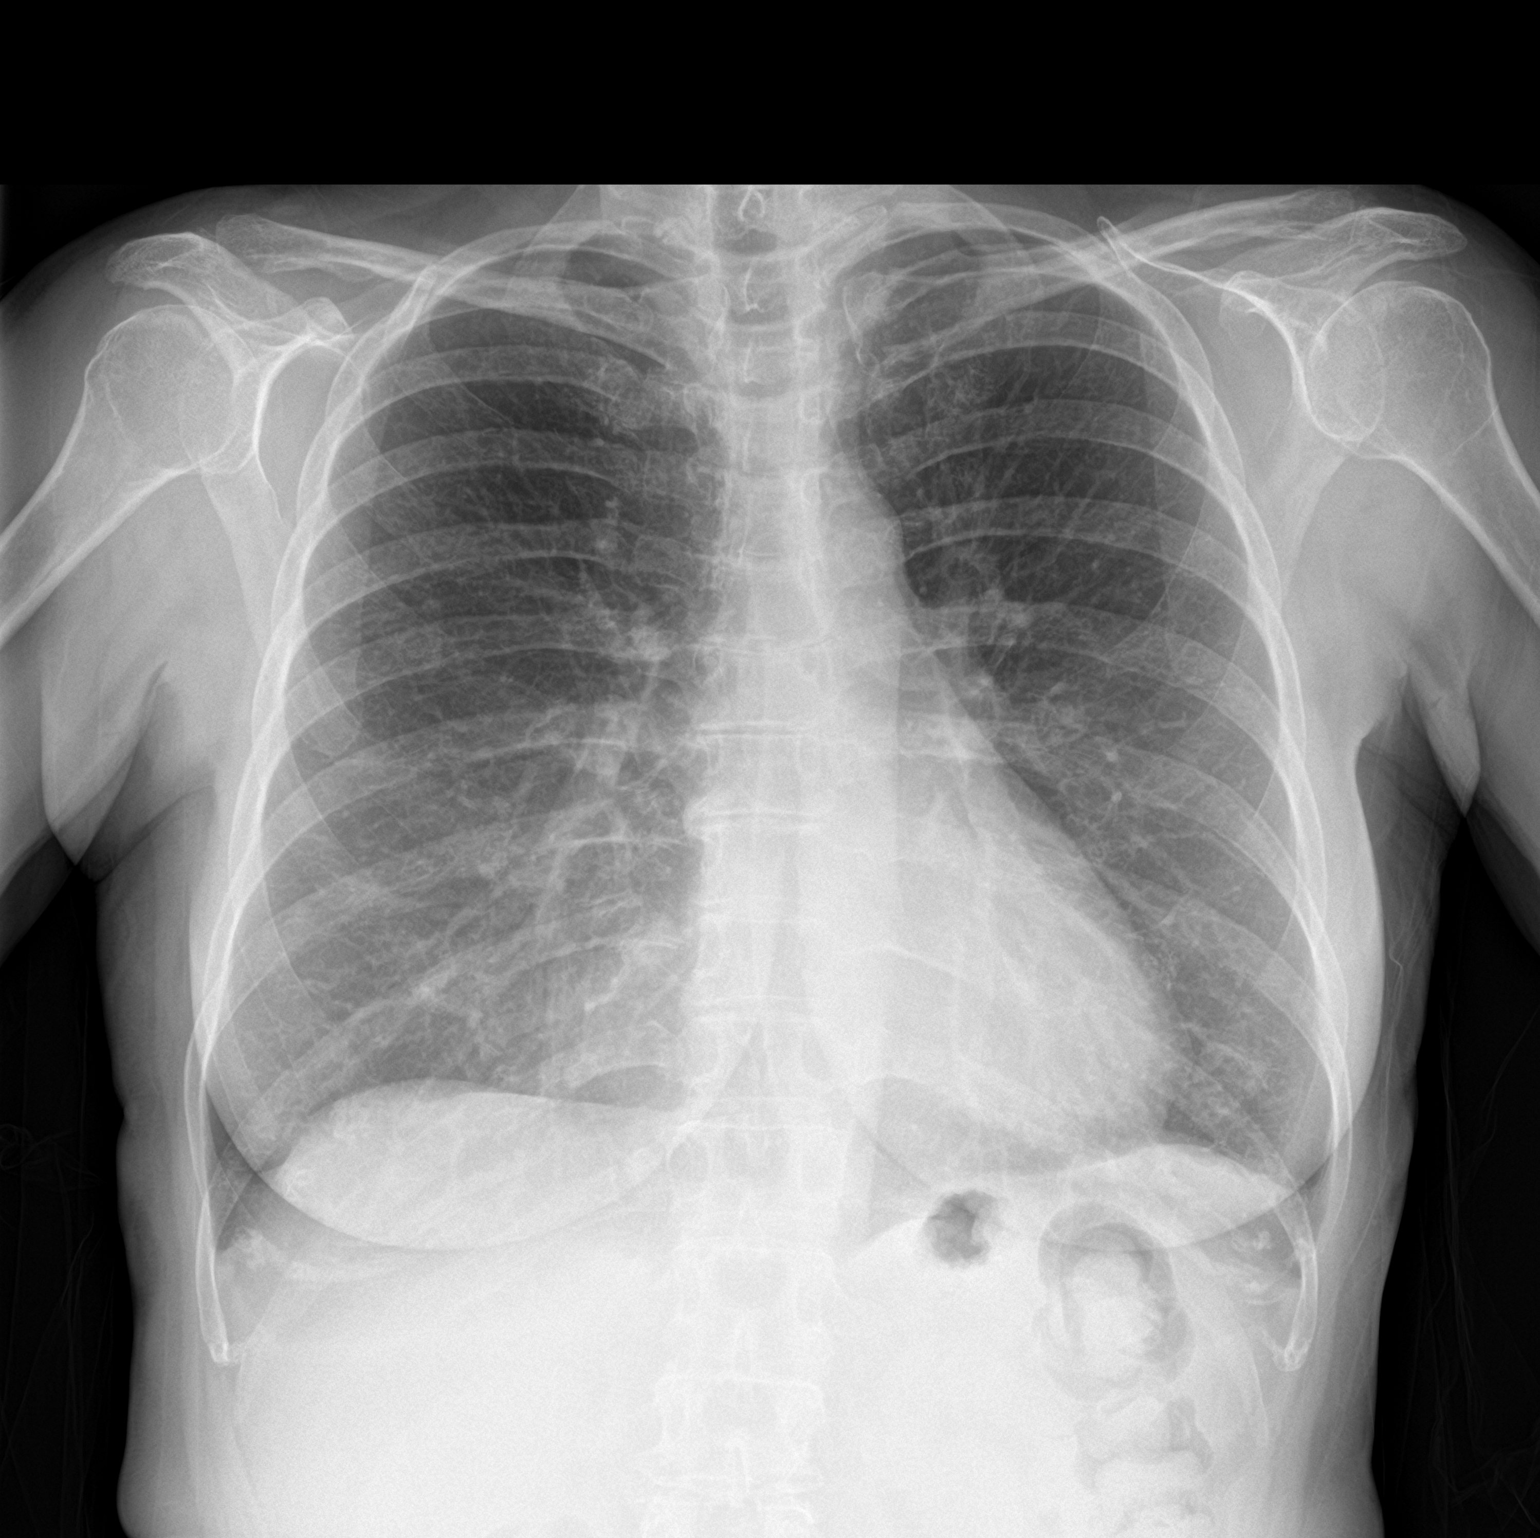

[chest lat]
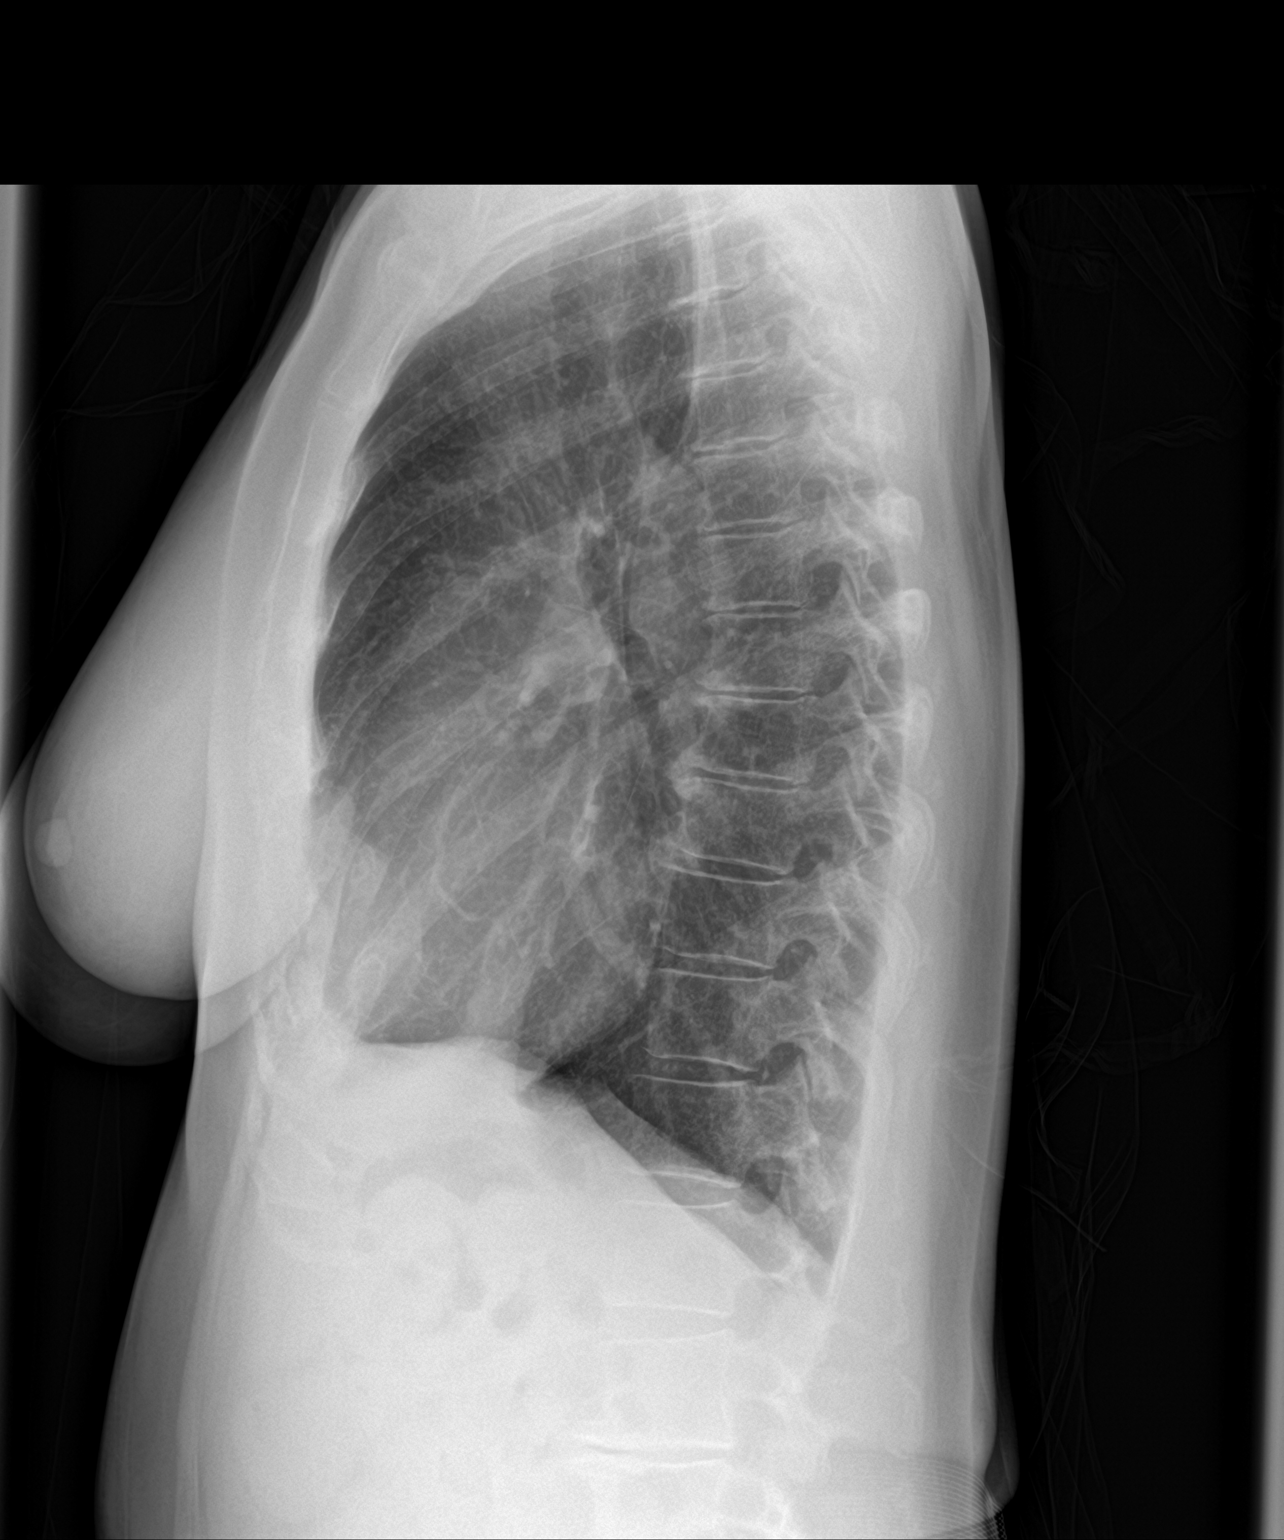

[2 of 2 positions shown; findings below may reference images not displayed]

FINDINGS: The heart size and mediastinal contours are within normal limits.
Both lungs are clear. The visualized skeletal structures are
unremarkable.
IMPRESSION: No active cardiopulmonary disease.
